# Patient Record
Sex: Male | Born: 1960 | Race: White | Hispanic: No | Marital: Married | Smoking: Never smoker
Health system: Southern US, Community
[De-identification: ages and names within clinical notes are randomized; demographics above are authoritative.]

## PROBLEM LIST (undated history)

## (undated) DIAGNOSIS — E079 Disorder of thyroid, unspecified: Secondary | ICD-10-CM

## (undated) DIAGNOSIS — I7300 Raynaud's syndrome without gangrene: Secondary | ICD-10-CM

## (undated) DIAGNOSIS — Z9109 Other allergy status, other than to drugs and biological substances: Secondary | ICD-10-CM

## (undated) DIAGNOSIS — F329 Major depressive disorder, single episode, unspecified: Secondary | ICD-10-CM

## (undated) DIAGNOSIS — K219 Gastro-esophageal reflux disease without esophagitis: Secondary | ICD-10-CM

## (undated) DIAGNOSIS — F32A Depression, unspecified: Secondary | ICD-10-CM

## (undated) HISTORY — PX: EYE SURGERY: SHX253

## (undated) HISTORY — PX: HERNIA REPAIR: SHX51

## (undated) HISTORY — PX: TONSILLECTOMY: SUR1361

---

## 1999-01-08 ENCOUNTER — Ambulatory Visit (HOSPITAL_COMMUNITY): Admission: RE | Admit: 1999-01-08 | Discharge: 1999-01-08 | Payer: Self-pay | Admitting: Orthopedic Surgery

## 1999-01-08 ENCOUNTER — Encounter: Payer: Self-pay | Admitting: Orthopedic Surgery

## 1999-02-06 ENCOUNTER — Ambulatory Visit (HOSPITAL_COMMUNITY): Admission: RE | Admit: 1999-02-06 | Discharge: 1999-02-06 | Payer: Self-pay | Admitting: *Deleted

## 1999-02-06 ENCOUNTER — Encounter: Payer: Self-pay | Admitting: *Deleted

## 2003-07-10 ENCOUNTER — Encounter: Admission: RE | Admit: 2003-07-10 | Discharge: 2003-07-10 | Payer: Self-pay | Admitting: Family Medicine

## 2003-08-07 ENCOUNTER — Encounter: Admission: RE | Admit: 2003-08-07 | Discharge: 2003-08-07 | Payer: Self-pay | Admitting: Neurological Surgery

## 2003-10-25 ENCOUNTER — Encounter: Admission: RE | Admit: 2003-10-25 | Discharge: 2003-10-25 | Payer: Self-pay | Admitting: Neurology

## 2003-11-16 ENCOUNTER — Encounter: Admission: RE | Admit: 2003-11-16 | Discharge: 2003-11-16 | Payer: Self-pay | Admitting: Neurology

## 2003-11-19 ENCOUNTER — Encounter: Admission: RE | Admit: 2003-11-19 | Discharge: 2003-11-19 | Payer: Self-pay | Admitting: Neurology

## 2005-09-23 ENCOUNTER — Encounter: Admission: RE | Admit: 2005-09-23 | Discharge: 2005-09-23 | Payer: Self-pay | Admitting: Neurology

## 2008-06-07 ENCOUNTER — Ambulatory Visit (HOSPITAL_BASED_OUTPATIENT_CLINIC_OR_DEPARTMENT_OTHER): Admission: RE | Admit: 2008-06-07 | Discharge: 2008-06-07 | Payer: Self-pay | Admitting: General Surgery

## 2009-08-06 ENCOUNTER — Encounter: Admission: RE | Admit: 2009-08-06 | Discharge: 2009-08-06 | Payer: Self-pay | Admitting: Family Medicine

## 2010-06-03 ENCOUNTER — Encounter: Payer: Self-pay | Admitting: Emergency Medicine

## 2010-06-03 ENCOUNTER — Ambulatory Visit
Admission: RE | Admit: 2010-06-03 | Discharge: 2010-06-03 | Payer: Self-pay | Source: Home / Self Care | Admitting: Emergency Medicine

## 2010-06-03 DIAGNOSIS — F3289 Other specified depressive episodes: Secondary | ICD-10-CM | POA: Insufficient documentation

## 2010-06-03 DIAGNOSIS — F329 Major depressive disorder, single episode, unspecified: Secondary | ICD-10-CM | POA: Insufficient documentation

## 2010-06-03 DIAGNOSIS — K219 Gastro-esophageal reflux disease without esophagitis: Secondary | ICD-10-CM | POA: Insufficient documentation

## 2010-06-03 DIAGNOSIS — E039 Hypothyroidism, unspecified: Secondary | ICD-10-CM | POA: Insufficient documentation

## 2010-06-03 DIAGNOSIS — E785 Hyperlipidemia, unspecified: Secondary | ICD-10-CM | POA: Insufficient documentation

## 2010-06-03 DIAGNOSIS — R079 Chest pain, unspecified: Secondary | ICD-10-CM | POA: Insufficient documentation

## 2010-06-09 ENCOUNTER — Telehealth (INDEPENDENT_AMBULATORY_CARE_PROVIDER_SITE_OTHER): Payer: Self-pay | Admitting: *Deleted

## 2010-06-26 NOTE — Progress Notes (Signed)
  Phone Note Outgoing Call Call back at [facility] Phone [phone]   Call placed by: Lajean Saver RN,  June 09, 2010 4:49 PM Call placed to: Patient Action Taken: Phone Call Completed Summary of Call: Callback: Patient reports he is doing much better and is on his last  day of Prednisone

## 2010-06-26 NOTE — Assessment & Plan Note (Signed)
Summary: PAIN ON RIGHT SIDE (RIB AREA)   Vital Signs:  Patient Profile:   50 Years Old Male CC:      right side/rib pain x 4 days Height:     68 inches Weight:      203 pounds O2 Sat:      96 % O2 treatment:    Room Air Temp:     98.9 degrees F oral Pulse rate:   67 / minute Resp:     16 per minute BP sitting:   136 / 89  (left arm) Cuff size:   large  Pt. in pain?   yes    Location:   right side    Intensity:   5    Type:       sharp  Vitals Entered By: Lajean Saver RN (June 03, 2010 11:18 AM)                   Updated Prior Medication List: WELLBUTRIN XL 300 MG XR24H-TAB (BUPROPION HCL)  ADVAIR DISKUS 250-50 MCG/DOSE AEPB (FLUTICASONE-SALMETEROL)  LEVOTHROID 25 MCG TABS (LEVOTHYROXINE SODIUM)  ASTEPRO 0.15 % SOLN (AZELASTINE HCL)  SIMVASTATIN 10 MG TABS (SIMVASTATIN)  RANITIDINE HCL 150 MG CAPS (RANITIDINE HCL)   Current Allergies: ! * GRASS ! * MOLDHistory of Present Illness History from: patient Chief Complaint: right side/rib pain x 4 days History of Present Illness: He was reaching into his car backseat from the driver's seat to reach something and felt a pain in his R side.  This has happened in the past but this time is worse.  Naproxen helps but he ran out.  No trauma.  Hurts with deep breaths and with pressing on it.  No CP, SOB, or other cardiac symptoms.  No URI symptoms.  REVIEW OF SYSTEMS Constitutional Symptoms      Denies fever, chills, night sweats, weight loss, weight gain, and fatigue.  Eyes       Denies change in vision, eye pain, eye discharge, glasses, contact lenses, and eye surgery. Ear/Nose/Throat/Mouth       Denies hearing loss/aids, change in hearing, ear pain, ear discharge, dizziness, frequent runny nose, frequent nose bleeds, sinus problems, sore throat, hoarseness, and tooth pain or bleeding.  Respiratory       Denies dry cough, productive cough, wheezing, shortness of breath, asthma, bronchitis, and emphysema/COPD.       Comments: painful inspiration Cardiovascular       Denies murmurs, chest pain, and tires easily with exhertion.    Gastrointestinal       Denies stomach pain, nausea/vomiting, diarrhea, constipation, blood in bowel movements, and indigestion. Genitourniary       Denies painful urination, kidney stones, and loss of urinary control. Neurological       Denies paralysis, seizures, and fainting/blackouts. Musculoskeletal       Complains of muscle pain and decreased range of motion.      Denies joint pain, joint stiffness, redness, swelling, muscle weakness, and gout.  Skin       Denies bruising, unusual mles/lumps or sores, and hair/skin or nail changes.  Psych       Denies mood changes, temper/anger issues, anxiety/stress, speech problems, depression, and sleep problems. Other Comments: Patient was leaning over to the passenger side of his [employer] Niece to pull up the seat from the drivers side and felt a "pull" on his right side. "it felt like my rib moved over the other one". He has had incrasing pain ever since onhis right  side.    Past History:  Past Medical History: GERD Hyperlipidemia Hypothyroidism Depression Allergies  Past Surgical History: Umbilical hernia  Left foot bone spur removed 2010 Tonsillectomy  Family History: none  Social History: Occupation: Full time Consulting civil engineer- teaching Married Never Smoked Alcohol use-yes Drug use-no 4 childrenSmoking Status:  never Drug Use:  no Physical Exam General appearance: well developed, well nourished, no acute distress Chest/Lungs: no rales, wheezes, or rhonchi bilateral, breath sounds equal without effort Skin: no obvious rashes or lesions Back no tenderness and FROM.  +TTP R midaxillary line around 7th rib and fairly localized.  AP squeezing causes pain.  No swelling or bruising. Assessment New Problems: RIB PAIN, RIGHT SIDED (ICD-786.50) DEPRESSION (ICD-311) HYPOTHYROIDISM (ICD-244.9) HYPERLIPIDEMIA (ICD-272.4) GERD  (ICD-530.81)   Plan New Medications/Changes: VOLTAREN 1 % GEL (DICLOFENAC SODIUM) apply two times a day for 2 weeks  #100g tube x 0, 06/03/2010, Hoyt Koch MD PREDNISONE (PAK) 10 MG TABS (PREDNISONE) 6 day pack, use as directed  #1 x 0, 06/03/2010, Hoyt Koch MD NAPROXEN 500 MG TABS (NAPROXEN) 1 by mouth two times a day as needed pain  #40 x 1, 06/03/2010, Hoyt Koch MD  New Orders: Abdominal binder/Rib belt [L0210] New Patient Level III [99203] Planning Comments:   Rx for Pred pack and when finished can switch to Naproxen.  Also Rx for Voltaren gel.  Rib belt was given and makes him feel better while wearing it.  Ice for a few days to decrease inflammation, hold on heating pad for now.  An Xray was not done because of no trauma and because management would be the same, however if pain continues for another week, can consider Rib Xray to see if a crack is seen.  Avoid heavy lifting, twisting, coughing that would further aggravate that area.  Follow-up with your primary care physician if not improving or if getting worse.   The patient and/or caregiver has been counseled thoroughly with regard to medications prescribed including dosage, schedule, interactions, rationale for use, and possible side effects and they verbalize understanding.  Diagnoses and expected course of recovery discussed and will return if not improved as expected or if the condition worsens. Patient and/or caregiver verbalized understanding.  Prescriptions: VOLTAREN 1 % GEL (DICLOFENAC SODIUM) apply two times a day for 2 weeks  #100g tube x 0   Entered and Authorized by:   Hoyt Koch MD   Signed by:   Hoyt Koch MD on 06/03/2010   Method used:   Print then Give to Patient   RxID:   [id number] PREDNISONE (PAK) 10 MG TABS (PREDNISONE) 6 day pack, use as directed  #1 x 0   Entered and Authorized by:   Hoyt Koch MD   Signed by:   Hoyt Koch MD on 06/03/2010   Method  used:   Print then Give to Patient   RxID:   [id number] NAPROXEN 500 MG TABS (NAPROXEN) 1 by mouth two times a day as needed pain  #40 x 1   Entered and Authorized by:   Hoyt Koch MD   Signed by:   Hoyt Koch MD on 06/03/2010   Method used:   Print then Give to Patient   RxID:   [id number]   Orders Added: 1)  Abdominal binder/Rib belt [L0210] 2)  New Patient Level III [74259]

## 2010-06-26 NOTE — Letter (Signed)
Summary: CONTROLLED MED POLICY   CONTROLLED MED POLICY   Imported By: Dannette Barbara 06/03/2010 11:20:10  _____________________________________________________________________  External Attachment:    Type:   Image     Comment:   External Document

## 2010-09-08 LAB — COMPREHENSIVE METABOLIC PANEL
AST: 28 U/L (ref 0–37)
CO2: 31 mEq/L (ref 19–32)
Calcium: 9.3 mg/dL (ref 8.4–10.5)

## 2010-09-08 LAB — DIFFERENTIAL
Basophils Absolute: 0 10*3/uL (ref 0.0–0.1)
Basophils Relative: 0 % (ref 0–1)
Eosinophils Relative: 5 % (ref 0–5)
Lymphs Abs: 1.5 10*3/uL (ref 0.7–4.0)
Monocytes Absolute: 0.7 10*3/uL (ref 0.1–1.0)
Neutro Abs: 3.2 10*3/uL (ref 1.7–7.7)
Neutrophils Relative %: 56 % (ref 43–77)

## 2010-09-08 LAB — CBC
HCT: 43.6 % (ref 39.0–52.0)
Hemoglobin: 14.7 g/dL (ref 13.0–17.0)
MCHC: 33.8 g/dL (ref 30.0–36.0)
Platelets: 337 10*3/uL (ref 150–400)
RBC: 4.74 MIL/uL (ref 4.22–5.81)
RDW: 13.7 % (ref 11.5–15.5)

## 2010-10-07 NOTE — Op Note (Signed)
NAMEBEACHER, EVERY                 ACCOUNT NO.:  [id number]   MEDICAL RECORD NO.:  [id number]          PATIENT TYPE:  AMB   LOCATION:  [facility]                         FACILITY:  [facility]   PHYSICIAN:  Anselm Pancoast. Weatherly, M.D.DATE OF BIRTH:  1960-10-10   DATE OF PROCEDURE:  06/07/2008  DATE OF DISCHARGE:                               OPERATIVE REPORT   PREOPERATIVE DIAGNOSIS:  Umbilical hernia.   OPERATION:  Repair of umbilical hernia with mesh in the preperitoneal  space.   ANESTHESIA:  General anesthesia.   SURGEON:  Anselm Pancoast. Zachery Dakins, M.D.   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 Holley Bouche, for a mildly symptomatic umbilical  hernia.  I saw the patient about two months ago.  He was talking about  trying to lose weight.  I recommended that we postpone doing anything  and we would see him back in two months, and see if he was able to lose  a little weight.  He returned as scheduled, but had not lost weight.  He  said he had been exercising, as far as walking, etc, but his hernia is  about the same.  There is a little bulge to the right of the umbilicus  and about a 25-cent size defect.  I recommended that we go ahead and  repair this with general anesthesia, and I will place a little piece of  Prolene mesh in the preperitoneal space.   DESCRIPTION OF PROCEDURE:  Preoperatively he was given 1 gram of Ancef.  He does not have PAS stockings, but was taken back to the operative  suite.  Induction of general anesthesia with Dr. __________.  Endotracheal tube, and then the abdomen was force-clipped and prepped  with Betadine solution and draped in a sterile manner.  The patient has  vitiligo and there is a white area at the umbilicus.  He has it in other  areas of his body.  I elected to make a little incision below the  umbilicus, kind of in an area which hopefully will be partially hidden.  The little incision below the  umbilicus was performed.  The skin was  lifted from the underlying subcutaneous tissues.  Then I made the  incision about 1 cm lateral to the umbilicus, as the fascial defect is  small.  Sharp dissection down through the skin and subcutaneous tissue.  The little preperitoneal herniated tissue was freed up  circumferentially.  The fascia was identified.  Then I used #2-0 Vicryl  sutures to actually go ahead and remove the little preperitoneal fatty  tissue and tied the little pedicles, and then closed the little  peritoneal defect with #2-0 locking Vicryl sutures.  I dissected so I  got about one inch under the fascia in all directions, and then I used a  little piece of Prolene mesh, probably about 1.5 inches x about 2.5  inches.  I anchored all four corners with #0 Prolene sutures.  Then  sutures medial, superiorly and inferiorly.  Next, I closed the fascia  over the  mesh, incorporating a little bit of the mesh with #0 Surgilon,  and it came together without any tension.  I then actually put Marcaine  in the incision, all total about 20 mL of Marcaine with adrenalin used.  Then I closed the subcutaneous tissue with #3-0 Vicryl and interrupted  sutures of #4-0 nylon on the skin, alternating simple and mattress  sutures.  Then 4 x 4's rolled up and placed in the umbilicus and a  sterile occlusive dressing applied.   He was awakened from the general anesthesia and will be discharged after  approximately one hour.  Hopefully he will be able to return to work  next week.  He will get his stitches out in approximately one week.  He  will refrain from doing any real strenuous activity for approximately  three to four weeks.           ______________________________  Anselm Pancoast. Zachery Dakins, M.D.     WJW/MEDQ  D:  06/07/2008  T:  06/07/2008  Job:  [id number]   cc:   Holley Bouche, M.D.  Fax: [phone]

## 2011-11-10 ENCOUNTER — Emergency Department
Admission: EM | Admit: 2011-11-10 | Discharge: 2011-11-10 | Disposition: A | Discharge status: Home / Self Care | Payer: PRIVATE HEALTH INSURANCE | Source: Home / Self Care | Attending: Family Medicine | Admitting: Family Medicine

## 2011-11-10 ENCOUNTER — Encounter: Payer: Self-pay | Admitting: *Deleted

## 2011-11-10 DIAGNOSIS — J4520 Mild intermittent asthma, uncomplicated: Secondary | ICD-10-CM

## 2011-11-10 DIAGNOSIS — J069 Acute upper respiratory infection, unspecified: Secondary | ICD-10-CM

## 2011-11-10 HISTORY — DX: Other allergy status, other than to drugs and biological substances: Z91.09

## 2011-11-10 HISTORY — DX: Depression, unspecified: F32.A

## 2011-11-10 HISTORY — DX: Gastro-esophageal reflux disease without esophagitis: K21.9

## 2011-11-10 HISTORY — DX: Disorder of thyroid, unspecified: E07.9

## 2011-11-10 HISTORY — DX: Major depressive disorder, single episode, unspecified: F32.9

## 2011-11-10 MED ORDER — BENZONATATE 200 MG PO CAPS: 200.0000 mg | ORAL_CAPSULE | Freq: Every day | ORAL | Status: AC

## 2011-11-10 MED ORDER — AMOXICILLIN 875 MG PO TABS: 875.0000 mg | ORAL_TABLET | Freq: Two times a day (BID) | ORAL | Status: AC

## 2011-11-10 MED ORDER — PREDNISONE 20 MG PO TABS: 20.0000 mg | ORAL_TABLET | Freq: Two times a day (BID) | ORAL | Status: AC

## 2011-11-10 NOTE — ED Notes (Signed)
Pt c/o nasal congestion, productive cough, and sinus pressure x 3 days. He has taken tylenol and IBF.

## 2011-11-10 NOTE — Discharge Instructions (Signed)
Take Mucinex D (guaifenesin with decongestant) twice daily for congestion, or take plain Mucinex plus Sudafed.  Increase fluid intake, rest. May use Afrin nasal spray (or generic oxymetazoline) twice daily for about 5 days.  Also recommend using saline nasal spray several times daily and saline nasal irrigation (AYR is a common brand).  Continue fluticasone nasal spray. Continue inhalers. Stop all antihistamines for now, and other non-prescription cough/cold preparations. Begin Amoxicillin if not improving about 5 days or if persistent fever develops. Follow-up with family doctor if not improving 7 to 10 days.

## 2011-11-10 NOTE — ED Provider Notes (Signed)
History     CSN: [id number]  Arrival date & time 11/10/11  1751   First MD Initiated Contact with Patient 11/10/11 1808      Chief Complaint  Patient presents with  . Nasal Congestion  . Cough     HPI Comments: Patient complains of approximately 4 day history of gradually progressive URI symptoms beginning with a mild sore throat (now improved), followed by progressive nasal congestion.  A cough started about 2 days ago.  Complains of fatigue but no myalgias.  Cough is now worse at night and generally productive during the day.  There has been no pleuritic pain, but he has had shortness of breath with activity and occasionally wheezes.  He has a history of well controlled intermittent asthma, and over the past several days has had to use his albuterol rescue inhaler several times.  He also uses Advair twice daily, and fluticasone nasal spray.  The history is provided by the patient.    Past Medical History  Diagnosis Date  . GERD (gastroesophageal reflux disease)   . Environmental allergies   . Thyroid disease   . Depression     Past Surgical History  Procedure Date  . Hernia repair   . Tonsillectomy   . Eye surgery     History reviewed. No pertinent family history.  History  Substance Use Topics  . Smoking status: Never Smoker   . Smokeless tobacco: Not on file  . Alcohol Use: Yes      Review of Systems + sore throat, resolved + cough No pleuritic pain + wheezing + nasal congestion + post-nasal drainage ? sinus pain/pressure No itchy/red eyes No earache No hemoptysis + SOB with activity No fever, + chills/sweats No nausea No vomiting No abdominal pain No diarrhea No urinary symptoms No skin rashes + fatigue No myalgias No headache Used OTC meds without relief (Robitussin DM) Allergies  Molds & smuts  Home Medications   Current Outpatient Rx  Name Route Sig Dispense Refill  . BUPROPION HCL ER (XL) 300 MG PO TB24 Oral Take 300 mg by mouth  daily.    Marland Kitchen FLUTICASONE PROPIONATE 50 MCG/ACT NA SUSP Nasal Place 2 sprays into the nose daily.    Marland Kitchen FLUTICASONE-SALMETEROL 250-50 MCG/DOSE IN AEPB Inhalation Inhale 1 puff into the lungs every 12 (twelve) hours.    Marland Kitchen LEVOTHYROXINE SODIUM 25 MCG PO TABS Oral Take 25 mcg by mouth daily.    . ORPHENADRINE CITRATE ER 100 MG PO TB12 Oral Take 100 mg by mouth 2 (two) times daily.    Marland Kitchen RANITIDINE HCL 150 MG PO TABS Oral Take 150 mg by mouth 2 (two) times daily.    Marland Kitchen SIMVASTATIN 10 MG PO TABS Oral Take 10 mg by mouth at bedtime.    . AMOXICILLIN 875 MG PO TABS Oral Take 1 tablet (875 mg total) by mouth 2 (two) times daily. (Rx void after 11/18/11) 20 tablet 0  . BENZONATATE 200 MG PO CAPS Oral Take 1 capsule (200 mg total) by mouth at bedtime. Take as needed for cough 12 capsule 0  . PREDNISONE 20 MG PO TABS Oral Take 1 tablet (20 mg total) by mouth 2 (two) times daily. 10 tablet 0    BP 137/88  Pulse 60  Temp 98.4 F (36.9 C) (Oral)  Resp 18  Ht 5\' 8"  (1.727 m)  Wt 185 lb 12 oz (84.256 kg)  BMI 28.24 kg/m2  SpO2 97%  Physical Exam Nursing notes and Vital Signs reviewed.  Appearance:  Patient appears healthy, stated age, and in no acute distress Eyes:  Pupils are equal, round, and reactive to light and accomodation.  Extraocular movement is intact.  Conjunctivae are not inflamed  Ears:  Canals normal.  Tympanic membranes normal.  Nose:  Mildly congested turbinates.  No sinus tenderness.    Pharynx:  Normal Neck:  Supple.  Slightly prominent shotty posterior nodes are palpated bilaterally  Lungs:  Clear to auscultation.  Breath sounds are equal.  Heart:  Regular rate and rhythm without murmurs, rubs, or gallops.  Abdomen:  Nontender without masses or hepatosplenomegaly.  Bowel sounds are present.  No CVA or flank tenderness.  Extremities:  No edema.  No calf tenderness Skin:  No rash present.   ED Course  Procedures  none      1. Acute upper respiratory infections of unspecified site    2. Intermittent asthma, well controlled       MDM  There is no evidence of bacterial infection today.   Begin prednisone burst.  Prescription written for Benzonatate (Tessalon) to take at bedtime for night-time cough.  Take Mucinex D (guaifenesin with decongestant) twice daily for congestion, or take plain Mucinex plus Sudafed.  Increase fluid intake, rest. May use Afrin nasal spray (or generic oxymetazoline) twice daily for about 5 days.  Also recommend using saline nasal spray several times daily and saline nasal irrigation (AYR is a common brand).  Continue fluticasone nasal spray. Continue inhalers. Stop all antihistamines for now, and other non-prescription cough/cold preparations. Begin Amoxicillin if not improving about 5 days or if persistent fever develops (Given a prescription to hold, with an expiration date)  Follow-up with family doctor if not improving 7 to 10 days.         Lattie Haw, MD 11/10/11 [phone]

## 2014-10-26 ENCOUNTER — Other Ambulatory Visit: Payer: Self-pay | Admitting: Family Medicine

## 2014-10-26 ENCOUNTER — Ambulatory Visit
Admission: RE | Admit: 2014-10-26 | Discharge: 2014-10-26 | Disposition: A | Discharge status: Home / Self Care | Payer: Managed Care, Other (non HMO) | Source: Ambulatory Visit | Attending: Family Medicine | Admitting: Family Medicine

## 2014-10-26 DIAGNOSIS — M25552 Pain in left hip: Secondary | ICD-10-CM

## 2015-03-21 ENCOUNTER — Emergency Department
Admission: EM | Admit: 2015-03-21 | Discharge: 2015-03-21 | Disposition: A | Discharge status: Home / Self Care | Payer: Managed Care, Other (non HMO) | Source: Home / Self Care | Attending: Family Medicine | Admitting: Family Medicine

## 2015-03-21 ENCOUNTER — Encounter: Payer: Self-pay | Admitting: *Deleted

## 2015-03-21 DIAGNOSIS — J0100 Acute maxillary sinusitis, unspecified: Secondary | ICD-10-CM

## 2015-03-21 DIAGNOSIS — J069 Acute upper respiratory infection, unspecified: Secondary | ICD-10-CM

## 2015-03-21 HISTORY — DX: Raynaud's syndrome without gangrene: I73.00

## 2015-03-21 MED ORDER — BENZONATATE 100 MG PO CAPS: 100.0000 mg | ORAL_CAPSULE | Freq: Three times a day (TID) | ORAL | Status: DC

## 2015-03-21 MED ORDER — PREDNISONE 20 MG PO TABS: ORAL_TABLET | ORAL | Status: DC

## 2015-03-21 MED ORDER — DOXYCYCLINE HYCLATE 100 MG PO CAPS: 100.0000 mg | ORAL_CAPSULE | Freq: Two times a day (BID) | ORAL | Status: DC

## 2015-03-21 MED ORDER — ALBUTEROL SULFATE HFA 108 (90 BASE) MCG/ACT IN AERS: 1.0000 | INHALATION_SPRAY | Freq: Four times a day (QID) | RESPIRATORY_TRACT | Status: AC | PRN

## 2015-03-21 NOTE — Discharge Instructions (Signed)
Please take antibiotics as prescribed and be sure to complete entire course even if you start to feel better to ensure infection does not come back.  You may take Naproxen and Alternate with Tylenol  You may take 500mg  Tylenol every 4-6 hours as needed for fever and pain  Follow-up with your primary care provider next week for recheck of symptoms if not improving.  Be sure to drink plenty of fluids and rest, at least 8hrs of sleep a night, preferably more while you are sick. Return urgent care or go to closest ER if you cannot keep down fluids/signs of dehydration, fever not reducing with Tylenol, difficulty breathing/wheezing, stiff neck, worsening condition, or other concerns (see below)

## 2015-03-21 NOTE — ED Notes (Signed)
Pt c/o non-productive cough, sinus congestion, facial pain and HA x 5-6 days. Taken Delsym and Zyrtec without much relief.

## 2015-03-21 NOTE — ED Provider Notes (Signed)
CSN: [id number]     Arrival date & time 03/21/15  1600 History   First MD Initiated Contact with Patient 03/21/15 1606     Chief Complaint  Patient presents with  . URI   (Consider location/radiation/quality/duration/timing/severity/associated sxs/prior Treatment) HPI Pt is a 54[facility] with c/o gradually worsening dry hacking cough with sinus congestion, frontal headache with facial pain for 5-6 days.  He has tried Delsym and Zyrtec w/o relief. He reports hx of mild intermittent well controlled asthma but has had prednisone in the past when he gets sick.  Pt also notes his wife has been sick with similar symptoms.  Denies fever, chills, n/v/d. He has not received the flu vaccine yet. Denies recent travel.   Past Medical History  Diagnosis Date  . GERD (gastroesophageal reflux disease)   . Environmental allergies   . Thyroid disease   . Depression   . Raynaud disease    Past Surgical History  Procedure Laterality Date  . Hernia repair    . Tonsillectomy    . Eye surgery     History reviewed. No pertinent family history. Social History  Substance Use Topics  . Smoking status: Never Smoker   . Smokeless tobacco: Never Used  . Alcohol Use: Yes    Review of Systems  Constitutional: Negative for fever and chills.  HENT: Positive for congestion, rhinorrhea, sinus pressure and sore throat. Negative for ear pain, trouble swallowing and voice change.   Respiratory: Positive for cough, shortness of breath and wheezing.   Cardiovascular: Negative for chest pain and palpitations.  Gastrointestinal: Negative for nausea, vomiting, abdominal pain and diarrhea.  Musculoskeletal: Negative for myalgias, back pain and arthralgias.  Skin: Negative for rash.  Neurological: Positive for headaches. Negative for dizziness and light-headedness.  All other systems reviewed and are negative.   Allergies  Molds & smuts  Home Medications   Prior to Admission medications    Medication Sig Start Date End Date Taking? Authorizing Provider  buPROPion (WELLBUTRIN XL) 300 MG 24 hr tablet Take 300 mg by mouth daily.   Yes Historical Provider, MD  Escitalopram Oxalate (LEXAPRO PO) Take by mouth.   Yes Historical Provider, MD  fluticasone (FLONASE) 50 MCG/ACT nasal spray Place 2 sprays into the nose daily.   Yes Historical Provider, MD  Fluticasone-Salmeterol (ADVAIR) 250-50 MCG/DOSE AEPB Inhale 1 puff into the lungs every 12 (twelve) hours.   Yes Historical Provider, MD  levothyroxine (SYNTHROID, LEVOTHROID) 25 MCG tablet Take 25 mcg by mouth daily.   Yes Historical Provider, MD  omeprazole (PRILOSEC) 40 MG capsule Take 40 mg by mouth daily.   Yes Historical Provider, MD  oxybutynin (DITROPAN) 5 MG tablet Take 5 mg by mouth 3 (three) times daily.   Yes Historical Provider, MD  albuterol (PROVENTIL HFA;VENTOLIN HFA) 108 (90 BASE) MCG/ACT inhaler Inhale 1-2 puffs into the lungs every 6 (six) hours as needed for wheezing or shortness of breath. 03/21/15   Junius FinnerErin O'Malley, PA-C  benzonatate (TESSALON) 100 MG capsule Take 1 capsule (100 mg total) by mouth every 8 (eight) hours. 03/21/15   Junius FinnerErin O'Malley, PA-C  doxycycline (VIBRAMYCIN) 100 MG capsule Take 1 capsule (100 mg total) by mouth 2 (two) times daily. One po bid x 7 days 03/21/15   Junius FinnerErin O'Malley, PA-C  predniSONE (DELTASONE) 20 MG tablet 3 tabs po day one, then 2 po daily x 4 days 03/21/15   Junius FinnerErin O'Malley, PA-C   Meds Ordered and Administered this Visit  Medications -  No data to display  BP 149/88 mmHg  Pulse 64  Temp(Src) 98.1 F (36.7 C) (Oral)  Wt 223 lb (101.152 kg)  SpO2 95% No data found.   Physical Exam  Constitutional: He appears well-developed and well-nourished.  HENT:  Head: Normocephalic and atraumatic.  Right Ear: Hearing, tympanic membrane, external ear and ear canal normal.  Left Ear: Hearing, tympanic membrane, external ear and ear canal normal.  Nose: Mucosal edema present. Right sinus exhibits  maxillary sinus tenderness. Right sinus exhibits no frontal sinus tenderness. Left sinus exhibits maxillary sinus tenderness. Left sinus exhibits no frontal sinus tenderness.  Mouth/Throat: Uvula is midline, oropharynx is clear and moist and mucous membranes are normal.  Eyes: Conjunctivae are normal. No scleral icterus.  Neck: Normal range of motion. Neck supple.  Cardiovascular: Normal rate, regular rhythm and normal heart sounds.   Pulmonary/Chest: Effort normal and breath sounds normal. No respiratory distress. He has no wheezes. He has no rales. He exhibits no tenderness.  No respiratory distress. Occasional dry hacking cough  Abdominal: Soft. Bowel sounds are normal. He exhibits no distension and no mass. There is no tenderness. There is no rebound and no guarding.  Musculoskeletal: Normal range of motion.  Neurological: He is alert.  Skin: Skin is warm and dry.  Nursing note and vitals reviewed.   ED Course  Procedures (including critical care time)  Labs Review Labs Reviewed - No data to display  Imaging Review No results found.     MDM   1. Acute maxillary sinusitis, recurrence not specified   2. Acute upper respiratory infection     Pt c/o worsening cough with nasal congestion and facial pain for 1 week. Signs and symptoms c/w maxillary sinusitis with URI Rx: doxycyline, prednisone, albuterol, and tessalon. Advised pt to use acetaminophen and ibuprofen as needed for fever and pain. Encouraged rest and fluids. F/u with PCP in 1 week if not improving, sooner if worsening.  Pt verbalized understanding and agreement with tx plan.     Junius Finner, PA-C 03/21/15 1722

## 2015-04-02 ENCOUNTER — Encounter: Payer: Self-pay | Admitting: Internal Medicine

## 2015-05-02 ENCOUNTER — Encounter: Payer: Self-pay | Admitting: Internal Medicine

## 2015-05-02 ENCOUNTER — Ambulatory Visit (INDEPENDENT_AMBULATORY_CARE_PROVIDER_SITE_OTHER): Payer: Managed Care, Other (non HMO) | Admitting: Internal Medicine

## 2015-05-02 VITALS — BP 124/78 | HR 63 | Temp 97.5°F | Resp 12 | Ht 67.0 in | Wt 220.0 lb

## 2015-05-02 DIAGNOSIS — R61 Generalized hyperhidrosis: Secondary | ICD-10-CM | POA: Diagnosis not present

## 2015-05-02 DIAGNOSIS — E039 Hypothyroidism, unspecified: Secondary | ICD-10-CM | POA: Diagnosis not present

## 2015-05-02 DIAGNOSIS — E291 Testicular hypofunction: Secondary | ICD-10-CM | POA: Diagnosis not present

## 2015-05-02 NOTE — Patient Instructions (Signed)
Please stay off Testosterone for now.  Come back for labs in 2 months, fasting, at 8 am.   Please come back for a follow-up appointment in 4 months.

## 2015-05-02 NOTE — Progress Notes (Signed)
Patient ID: Zachary Aguirre   DOB: 1960-12-23, 54 y.o.   MRN: [id number]  HPI: Zachary Aguirre is a 54 y.o.-year-old man, referred by his PCP, Dr. Tiburcio Pea, for evaluation and management of hypogonadism and hypothyroidism.  He was diagnosed with hypogonadism in Spring 2016, during investigation for sweating and weight gain. He was started on testosterone 1.62% AndroGel, and he is currently applying for pumps daily. Per review of PCPs office visit notes, the AndroGel dose was increased in 09/2014, as a testosterone level was low at that time at 141. After this increase in dose, a recent testosterone level from 02/04/2015 was normal, 394.6 ([phone]). He started to feel a little better after he increased testosterone.  Of note, he also has excessive sweating, and uses Ditropan 5 mg twice a day for this. He is on omeprazole since 2 years ago.  He did not take Testosterone for 2 weeks. Does not feel different.  He admits for decreased libido >> slightly better Has difficulty obtaining or maintaining an erection >> only a little better. Tried Viagra >> worked, but HA the second time he used it. Did not see urology. No trauma to testes, testicular irradiation or surgery No h/o of mumps orchitis/but has h/o autoimmune ds.(vitiligo) No h/o cryptorchidism. He grew and went through puberty like his peers. No shrinking of testes. No very small testes (<5 ml).    No breast discomfort/gynecomastia    No loss of body hair (axillary/pubic)/decreased need for shaving. + height loss. No abnormal sense of smell (only allergies). ? hot flushes No vision problems. No worst HA of his life. No FH of hypogonadism/infertility  No personal h/o infertility - has 4 children No FH of hemochromatosis or pituitary tumors + weight gain (35 lbs in last 2 year) - cut out snacks No chronic diseases No chronic pain. Not on opiates, had steroid inj in spine - 2005. Had Prednisone tapers for SOB. No more than 2 drinks a  day of alcohol at a time, and this is rarely No anabolic steroids use. No herbal medicines. He is on bupropion. He continues Lexapro. ? family history of early cardiac disease.  He also has a history of elevated AST and ALT.  Hypothyroidism: - dx 2016 - Latest TSH (02/04/2015) 3.12 on LT4 50 g daily. - He takes the levothyroxine: Fasting With water Eats breakfast more than 30 minutes later Takes omeprazole in am, close to LT4! Takes multivitamins in am! No calcium, iron.  ROS: Constitutional: + weight gain, + fatigue, + subjective hyperthermia, + poor sleep Eyes: no blurry vision, no xerophthalmia ENT: no sore throat, no nodules palpated in throat, no dysphagia/odynophagia, no hoarseness, + decreased hearing Cardiovascular: no CP/SOB/palpitations/leg swelling Respiratory: no cough/SOB/+ wheezing Gastrointestinal: no N/V/D/C, + acid reflux Musculoskeletal: no muscle/+ joint aches Skin: no rashes Neurological: no tremors/numbness/tingling/dizziness Psychiatric: + both: depression/anxiety + see HPI  Past Medical History  Diagnosis Date  . GERD (gastroesophageal reflux disease)   . Environmental allergies   . Thyroid disease   . Depression   . Raynaud disease    Past Surgical History  Procedure Laterality Date  . Hernia repair    . Tonsillectomy    . Eye surgery     Social History   Social History  . Marital Status: Married    Spouse Name: N/A  . Number of Children: 4   Occupational History  . librarian   Social History Main Topics  . Smoking status: Never Smoker   . Smokeless tobacco:  Never Used  . Alcohol Use: Yes, wine, beer - socially  . Drug Use: No   Current Outpatient Prescriptions on File Prior to Visit  Medication Sig Dispense Refill  . albuterol (PROVENTIL HFA;VENTOLIN HFA) 108 (90 BASE) MCG/ACT inhaler Inhale 1-2 puffs into the lungs every 6 (six) hours as needed for wheezing or shortness of breath. 1 Inhaler 0  . buPROPion (WELLBUTRIN XL) 300  MG 24 hr tablet Take 300 mg by mouth daily.    . Escitalopram Oxalate (LEXAPRO PO) Take 10 mg by mouth daily.     . fluticasone (FLONASE) 50 MCG/ACT nasal spray Place 2 sprays into the nose daily.    . Fluticasone-Salmeterol (ADVAIR) 250-50 MCG/DOSE AEPB Inhale 1 puff into the lungs every 12 (twelve) hours.    Marland Kitchen. levothyroxine (SYNTHROID, LEVOTHROID) 25 MCG tablet Take 25 mcg by mouth daily.    Marland Kitchen. omeprazole (PRILOSEC) 40 MG capsule Take 40 mg by mouth daily.    Marland Kitchen. oxybutynin (DITROPAN) 5 MG tablet Take 5 mg by mouth 3 (three) times daily.    . predniSONE (DELTASONE) 20 MG tablet 3 tabs po day one, then 2 po daily x 4 days 11 tablet 0   No current facility-administered medications on file prior to visit.   Allergies  Allergen Reactions  . Ibuprofen Other (See Comments)  . Molds & Smuts    No family history on file. patient is adopted.  PE: BP 124/78 mmHg  Pulse 63  Temp(Src) 97.5 F (36.4 C) (Oral)  Resp 12  Ht 5\' 7"  (1.702 m)  Wt 220 lb (99.791 kg)  BMI 34.45 kg/m2  SpO2 97% Wt Readings from Last 3 Encounters:  05/02/15 220 lb (99.791 kg)  03/21/15 223 lb (101.152 kg)  11/10/11 185 lb 12 oz (84.256 kg)   Constitutional: overweight, in NAD Eyes: PERRLA, EOMI, no exophthalmos ENT: moist mucous membranes, no thyromegaly, no cervical lymphadenopathy Cardiovascular: RRR, No MRG Respiratory: CTA B Gastrointestinal: abdomen soft, NT, ND, BS+ Musculoskeletal: no deformities, strength intact in all 4 Skin: moist, warm, no rashes Neurological: no tremor with outstretched hands, DTR normal in all 4 Genital exam: normal male escutcheon, no inguinal LAD, normal phallus, testes ~25 mL, no testicular masses, no penile discharge. Skin discoloration (dark, macular, on the right scrotum) No gynecomastia.  ASSESSMENT: 1. Hypogonadism  2. Hypothyroidism  3. Excessive sweating  PLAN:  1. Hypogonadism - Patient with a relatively recent history of hypogonadism, which was diagnosed during  investigation for excessive sweating and weight gain. He also mentions that he does have low libido and problems with erections, which have not improved too much even after starting testosterone gel this spring. He is currently using 4 pumps of AndroGel daily and a testosterone level obtained 3 months ago was within normal range. He is disappointed about the lack of effect of testosterone. - I am not if patient had investigation for hypogonadism at diagnosis, but he does not remember having this. At this visit we discussed about needing to know why he has the low testosterone. We discussed about possible causes, to include:   Drugs (he is on Lexapro and has had steroid injections in the past, but not in the last 10 years per his report; he has not used anabolic steroids in the past that is not using illicit drugs now)  Pituitary tumors  Chronic conditions (he does not have diabetes, severe hyperlipidemia; he does have elevated transaminases...)  Weight gain (he has had quite a bit of weight gain over the  years which can cause hypogonadism)  Obstructive sleep apnea (no history of this)  Age-related (possibly)  Hemochromatosis  Testicular pathology including cancer - He hurt his shoulder 2 weeks ago and was not able to apply his testosterone for this period. We discussed to continue to stay off testosterone for another 2 months and come back for further investigation and the end of this period to check for causes of his hypogonadism. He agrees with this and is actually relieved that he does not need to continue testosterone for now. We did discuss about the need to restart testosterone after our investigation, but also the possibility that he does not restart the medication. He will think about it. - At this point, I am not sure if he has primary or secondary hypogonadism, but will check the following tests to clarify: Orders Placed This Encounter  Procedures  . Testosterone, Free, Total, SHBG   . Insulin-like growth factor  . Follicle stimulating hormone  . T4, free  . TSH  . Estradiol  . Beta HCG, Quant (tumor marker)  . Prolactin  . IBC Panel  . PSA  . Cortisol  . ACTH  . Hemoglobin A1c  - I advised the patient that we may need a pituitary MRI when the results of the tests are back  2. Hypothyroidism - I explained that his mildly elevated TSH could have appeared in the setting of his weight gain, and it may improve when he loses the weight - Continue levothyroxine 50 g for now - I advised him to take the thyroid hormone every day, with water, >30 minutes before breakfast, separated by >4 hours from: - acid reflux medications - calcium - iron - multivitamins. - We'll recheck his thyroid tests in 2 months  3. Excessive sweating - Could be related to the omeprazole - I advised him to give it a try without the omeprazole, and replace it with ranitidine, but he mentions that his GERD is severe and he had forgotten the omeprazole in the past with great increasing his symptoms.  - time spent with the patient: 1 hour, of which >50% was spent in obtaining information about his symptoms, reviewing his previous labs, evaluations, and treatments, counseling him about his condition (please see the discussed topics above), and developing a plan to further investigate it; he had a number of questions which I addressed.

## 2015-07-03 ENCOUNTER — Other Ambulatory Visit: Payer: Managed Care, Other (non HMO)

## 2015-07-23 ENCOUNTER — Other Ambulatory Visit (INDEPENDENT_AMBULATORY_CARE_PROVIDER_SITE_OTHER): Payer: Managed Care, Other (non HMO)

## 2015-07-23 DIAGNOSIS — E291 Testicular hypofunction: Secondary | ICD-10-CM | POA: Diagnosis not present

## 2015-07-23 LAB — FOLLICLE STIMULATING HORMONE: FSH: 5.9 m[IU]/mL (ref 1.4–18.1)

## 2015-07-23 LAB — T4, FREE: Free T4: 0.83 ng/dL (ref 0.60–1.60)

## 2015-07-23 LAB — IBC PANEL
IRON: 70 ug/dL (ref 42–165)
SATURATION RATIOS: 20.6 % (ref 20.0–50.0)
Transferrin: 243 mg/dL (ref 212.0–360.0)

## 2015-07-23 LAB — HEMOGLOBIN A1C: Hgb A1c MFr Bld: 6.1 % (ref 4.6–6.5)

## 2015-07-23 LAB — CORTISOL: Cortisol, Plasma: 6.8 ug/dL

## 2015-07-23 LAB — PSA: PSA: 0.3 ng/mL (ref 0.10–4.00)

## 2015-07-23 LAB — TSH: TSH: 3.81 u[IU]/mL (ref 0.35–4.50)

## 2015-07-25 LAB — TESTOSTERONE, FREE AND TOTAL (INCLUDES SHBG)-(MALES)
SEX HORMONE BINDING: 19 nmol/L (ref 10–50)
TESTOSTERONE FREE: 39.8 pg/mL — AB (ref 47.0–244.0)
Testosterone-% Free: 2.5 % (ref 1.6–2.9)
Testosterone: 160 ng/dL — ABNORMAL LOW (ref 250–827)

## 2015-07-25 LAB — PROLACTIN: PROLACTIN: 3.9 ng/mL (ref 2.0–18.0)

## 2015-07-26 LAB — ACTH: C206 ACTH: 20 pg/mL (ref 6–50)

## 2015-07-26 LAB — INSULIN-LIKE GROWTH FACTOR
IGF-I, LC/MS: 71 ng/mL (ref 50–317)
Z-SCORE (MALE): -1.3 {STDV} (ref ?–2.0)

## 2015-07-26 LAB — BETA HCG QUANT (REF LAB): Beta hCG, Tumor Marker: <2 m[IU]/mL (ref <5.0)

## 2015-07-31 LAB — ESTRADIOL, FREE
Estradiol, Free: 0.45 pg/mL (ref <=0.45)
Estradiol: 22 pg/mL (ref <=29)

## 2015-08-02 ENCOUNTER — Other Ambulatory Visit: Payer: Self-pay | Admitting: Internal Medicine

## 2015-08-02 DIAGNOSIS — E291 Testicular hypofunction: Secondary | ICD-10-CM

## 2015-08-13 ENCOUNTER — Inpatient Hospital Stay: Admission: RE | Admit: 2015-08-13 | Payer: Managed Care, Other (non HMO) | Source: Ambulatory Visit

## 2015-08-15 ENCOUNTER — Encounter: Payer: Self-pay | Admitting: Internal Medicine

## 2015-08-15 ENCOUNTER — Telehealth: Payer: Self-pay | Admitting: Internal Medicine

## 2015-08-15 NOTE — Telephone Encounter (Signed)
Insurance denied and pt cancelled appt scheduled for 08/13/15 on 08/12/15 and said he had an emergency and would have to call back later to r/s.

## 2015-08-29 ENCOUNTER — Telehealth: Payer: Self-pay | Admitting: Internal Medicine

## 2015-08-29 NOTE — Telephone Encounter (Signed)
Please read message below and advise.  

## 2015-08-29 NOTE — Telephone Encounter (Signed)
Yes, it was sent. Have not heard from them.

## 2015-08-29 NOTE — Telephone Encounter (Signed)
Pt insurance [employer] has declined him having the mri, he does not know what to do he cancelled his upcoming appt because of this

## 2015-08-29 NOTE — Telephone Encounter (Signed)
He should not have cancelled... I put together a letter for the appeal. We did send that, right?

## 2015-08-30 ENCOUNTER — Ambulatory Visit: Payer: Managed Care, Other (non HMO) | Admitting: Internal Medicine

## 2016-01-15 ENCOUNTER — Ambulatory Visit: Payer: Managed Care, Other (non HMO) | Admitting: Internal Medicine

## 2016-03-12 ENCOUNTER — Encounter: Payer: Self-pay | Admitting: Internal Medicine

## 2016-03-12 ENCOUNTER — Ambulatory Visit (INDEPENDENT_AMBULATORY_CARE_PROVIDER_SITE_OTHER): Payer: Managed Care, Other (non HMO) | Admitting: Internal Medicine

## 2016-03-12 VITALS — BP 132/88 | HR 68 | Wt 207.0 lb

## 2016-03-12 DIAGNOSIS — E291 Testicular hypofunction: Secondary | ICD-10-CM | POA: Diagnosis not present

## 2016-03-12 DIAGNOSIS — E039 Hypothyroidism, unspecified: Secondary | ICD-10-CM

## 2016-03-12 LAB — T4, FREE: Free T4: 0.81 ng/dL (ref 0.60–1.60)

## 2016-03-12 LAB — TSH: TSH: 1.73 u[IU]/mL (ref 0.35–4.50)

## 2016-03-12 NOTE — Patient Instructions (Signed)
Please stop at the lab.  Please continue levothyroxine 50 mcg daily.  Take the thyroid hormone every day, with water, at least 30 minutes before breakfast, separated by at least 4 hours from: - acid reflux medications - calcium - iron - multivitamins  Please come back for a follow-up appointment in 6 months.

## 2016-03-12 NOTE — Progress Notes (Signed)
Patient ID: Zachary Aguirre, male   DOB: 22-Aug-1960, 55 y.o.   MRN: [id number]  HPI: Zachary Aguirre is a 55 y.o.-year-old man, initially referred by his PCP, Dr. 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 Last visit 04/2015.  Reviewed and addended hx: He was diagnosed with hypogonadism in Spring 2016, during investigation for sweating and weight gain. He was started on testosterone 1.62% AndroGel >> 4 pumps daily. Per review of PCPs office visit notes, the AndroGel dose was increased in 09/2014, as a testosterone level was low at that time at 141. After this increase in dose, a  testosterone level from 02/04/2015 was normal, 394.6 ([phone]). He started to feel a little better after he increased testosterone.  Of note, he also has excessive sweating, and uses Ditropan 5 mg twice a day for this. He is on omeprazole since 2 years ago.  At last visit, he was off Testosterone for 2 weeks. Did not feel different.  We continued off testosterone >> repeat labs showed a low T, but normal pituitary hh and other pertinent labs: Component     Latest Ref Rng & Units 07/23/2015  Testosterone     250 - 827 ng/dL 409 (L)  Sex Horm Binding Glob, Serum     10 - 50 nmol/L 19  Testosterone Free     47.0 - 244.0 pg/mL 39.8 (L)  Testosterone-% Free     1.6 - 2.9 % 2.5  Iron     42 - 165 ug/dL 70  Transferrin     811.9 - 360.0 mg/dL 147.8  Saturation Ratios     20.0 - 50.0 % 20.6  IGF-I, LC/MS     50 - 317 ng/mL 71  Z-Score (Male)     -2.0 - 2.0 SD -1.3  Estradiol, Free     ADULTS: < OR = 0.45 pg/mL 0.45  Estradiol     ADULTS: < OR = 29 pg/mL 22  FSH     1.4 - 18.1 mIU/ML 5.9  Beta hCG, Tumor Marker     <5.0 mIU/mL < 2.0  Prolactin     2.0 - 18.0 ng/mL 3.9  PSA     0.10 - 4.00 ng/mL 0.30  Cortisol, Plasma     ug/dL 6.8  G956 ACTH     6 - 50 pg/mL 20  Hemoglobin A1C     4.6 - 6.5 % 6.1  We wanted to check a pituitary MRI >> not covered by  insurance.  He restarted testosterone gel - 2 pumps a day 08/2015 >> but again stopped after the following labs in 11/2015: Labs by PCP: 12/04/2015: Total testosterone 211 ([phone]) - 10:37 am  He feels tired, initially weight gain, now stabilized, libido nonexistent, excessive sweating - became worse when tried to come off Omeprazole.   He admits for decreased libido. Has difficulty obtaining or maintaining an erection >> only a little better. Tried Viagra >> worked, but HA the second time he used it. Did not see urology. No trauma to testes, testicular irradiation or surgery No h/o of mumps orchitis/but has h/o autoimmune ds.(vitiligo) No h/o cryptorchidism. He grew and went through puberty like his peers. No shrinking of testes. No very small testes (<5 ml).    No breast discomfort/gynecomastia    No loss of body hair (axillary/pubic)/decreased need for shaving. + height loss. No abnormal sense of smell (only allergies). ? hot flushes No vision problems. No worst HA of his life. No FH  of hypogonadism/infertility  No personal h/o infertility - has 4 children No FH of hemochromatosis or pituitary tumors + weight gain (35 lbs in last 2 year) - cut out snacks No chronic diseases No chronic pain. Not on opiates, had steroid inj in spine - 2005. Had Prednisone tapers for SOB. No more than 2 drinks a day of alcohol at a time, and this is rarely No anabolic steroids use. No herbal medicines. He is on bupropion. He continues Lexapro. ? family history of early cardiac disease.  He also has a history of elevated AST and ALT.  Hypothyroidism: - dx 2016 - Latest TSH (02/04/2015) 3.12 on LT4 50 g daily. - He takes the levothyroxine: Fasting With water Eats breakfast more than 30 minutes later Takes omeprazole later in the day Takes multivitamins later in the day No calcium, iron.  Latest TFTs: Lab Results  Component Value Date   TSH 3.81 07/23/2015   FREET4 0.83 07/23/2015     ROS: Constitutional: + weight gain, + fatigue, + subjective hyperthermia, + poor sleep Eyes: no blurry vision, no xerophthalmia ENT: no sore throat, no nodules palpated in throat, no dysphagia/odynophagia, no hoarseness Cardiovascular: no CP/SOB/palpitations/leg swelling Respiratory: no cough/SOB/ wheezing Gastrointestinal: no N/V/D/C, + acid reflux Musculoskeletal: no muscle/joint aches Skin: no rashes Neurological: no tremors/numbness/tingling/dizziness  I reviewed pt's medications, allergies, PMH, social hx, family hx, and changes were documented in the history of present illness. Otherwise, unchanged from my initial visit note.  Past Medical History:  Diagnosis Date  . Depression   . Environmental allergies   . GERD (gastroesophageal reflux disease)   . Raynaud disease   . Thyroid disease    Past Surgical History:  Procedure Laterality Date  . EYE SURGERY    . HERNIA REPAIR    . TONSILLECTOMY     Social History   Social History  . Marital Status: Married    Spouse Name: N/A  . Number of Children: 4   Occupational History  . librarian   Social History Main Topics  . Smoking status: Never Smoker   . Smokeless tobacco: Never Used  . Alcohol Use: Yes, wine, beer - socially  . Drug Use: No   Current Outpatient Prescriptions on File Prior to Visit  Medication Sig Dispense Refill  . albuterol (PROVENTIL HFA;VENTOLIN HFA) 108 (90 BASE) MCG/ACT inhaler Inhale 1-2 puffs into the lungs every 6 (six) hours as needed for wheezing or shortness of breath. 1 Inhaler 0  . buPROPion (WELLBUTRIN XL) 300 MG 24 hr tablet Take 300 mg by mouth daily.    . Escitalopram Oxalate (LEXAPRO PO) Take 10 mg by mouth daily.     . fluticasone (FLONASE) 50 MCG/ACT nasal spray Place 2 sprays into the nose daily.    . Fluticasone-Salmeterol (ADVAIR) 250-50 MCG/DOSE AEPB Inhale 1 puff into the lungs every 12 (twelve) hours.    Marland Kitchen. levothyroxine (SYNTHROID, LEVOTHROID) 25 MCG tablet Take 25 mcg  by mouth daily.    Marland Kitchen. omeprazole (PRILOSEC) 40 MG capsule Take 40 mg by mouth daily.    Marland Kitchen. oxybutynin (DITROPAN) 5 MG tablet Take 5 mg by mouth 3 (three) times daily.    . predniSONE (DELTASONE) 20 MG tablet 3 tabs po day one, then 2 po daily x 4 days 11 tablet 0  . ANDROGEL PUMP 20.25 MG/ACT (1.62%) GEL apply TWO pumps EVERY DAY  5   No current facility-administered medications on file prior to visit.    Allergies  Allergen Reactions  . Ibuprofen  Other (See Comments)  . Molds & Smuts    No family history on file. patient is adopted.  PE: BP 132/88 (BP Location: Left Arm, Patient Position: Sitting)   Pulse 68   Wt 207 lb (93.9 kg)   SpO2 96%   BMI 32.42 kg/m  Wt Readings from Last 3 Encounters:  03/12/16 207 lb (93.9 kg)  05/02/15 220 lb (99.8 kg)  03/21/15 223 lb (101.2 kg)   Constitutional: overweight, in NAD Eyes: PERRLA, EOMI, no exophthalmos ENT: moist mucous membranes, no thyromegaly, no cervical lymphadenopathy Cardiovascular: RRR, No MRG Respiratory: CTA B Gastrointestinal: abdomen soft, NT, ND, BS+ Musculoskeletal: no deformities, strength intact in all 4 Skin: moist, warm, no rashes Neurological: no tremor with outstretched hands, DTR normal in all 4  ASSESSMENT: 1. Hypogonadotropic Hypogonadism  2. Hypothyroidism  PLAN:  1. Hypogonadotropic Hypogonadism - Patient with a relatively recent history of hypogonadism, which was diagnosed during investigation for excessive sweating and weight gain. He also mentions that he does have low libido and problems with erections, which have not improved too much even after starting testosterone gel Spring 2016. He then came off testosterone but feels that his sweating and weight gain was possibly worse after this. He restarted testosterone in 08/2015 and stopped in 11/2015. He has now been off testosterone for approximately 3 months. However, due to his low libido, weight gain and increased sweating, he would be interested to  restart it. - I do not have a clear reason for his hypogonadism. Possible etiologies:  Drugs (he is on Lexapro and has had steroid injections in the past, but not in the last 10 years per his report; he has not used anabolic steroids in the past that is not using illicit drugs now)  Pituitary tumor - I doubt this in the setting of normal pituitary workup (except testosterone) - an MRI was denied by his insurance...  Chronic conditions (he does not have diabetes, severe hyperlipidemia; he does have elevated transaminases...)  Weight gain (he has had quite a bit of weight gain over the years which can cause hypogonadism)  Obstructive sleep apnea (no history of this)  Age-related (possibly)  Hemochromatosis (IBC panel was normal)   Testicular pathology including cancer (Beta hCG and estradiol were normal)  - we will check his testosterone again today, since he is fasting - He agrees to restart testosterone if this is low.  2. Hypothyroidism - Continue levothyroxine 50 g for now - I advised him to take the thyroid hormone every day, with water, >30 minutes before breakfast, separated by >4 hours from: - acid reflux medications - calcium - iron - multivitamins. At last visit, we separated PPIs a multivitamins from LT 4. He is taking it correctly now. - We'll recheck his thyroid tests today  Component     Latest Ref Rng & Units 03/12/2016  Testosterone     264 - 916 ng/dL 409 (L)  Sex Horm Binding Glob, Serum     19.3 - 76.4 nmol/L 24.7  Testosterone Free     7.2 - 24.0 pg/mL 5.0 (L)  T4,Free(Direct)     0.60 - 1.60 ng/dL 8.11  TSH     9.14 - 7.82 uIU/mL 1.73   Testosterone is still low >> will restart Testosterone at a lower dose, 1 pump daily and recheck a level in 2 months.  Carlus Pavlov, MD PhD [facility] End[facility]

## 2016-03-13 ENCOUNTER — Other Ambulatory Visit: Payer: Self-pay

## 2016-03-13 ENCOUNTER — Telehealth: Payer: Self-pay

## 2016-03-13 LAB — TESTOSTERONE, FREE, TOTAL, SHBG
Sex Hormone Binding: 24.7 nmol/L (ref 19.3–76.4)
TESTOSTERONE FREE: 5 pg/mL — AB (ref 7.2–24.0)
Testosterone: 212 ng/dL — ABNORMAL LOW (ref 264–916)

## 2016-03-13 MED ORDER — TESTOSTERONE 20.25 MG/1.25GM (1.62%) TD GEL: TRANSDERMAL | 1 refills | Status: DC

## 2016-03-13 MED ORDER — ANDROGEL 20.25 MG/1.25GM (1.62%) TD GEL: TRANSDERMAL | 2 refills | Status: DC

## 2016-03-13 NOTE — Telephone Encounter (Signed)
Called patient and gave lab results. Patient had no questions or concerns. Printed out RX for patient to start Androgel again. Patient agreed. Has no other questions and got patient scheduled for labs in 2 months.

## 2016-05-19 ENCOUNTER — Other Ambulatory Visit: Payer: Managed Care, Other (non HMO)

## 2016-05-28 ENCOUNTER — Other Ambulatory Visit: Payer: Managed Care, Other (non HMO)

## 2016-06-14 ENCOUNTER — Emergency Department (HOSPITAL_COMMUNITY)
Admission: EM | Admit: 2016-06-14 | Discharge: 2016-06-14 | Disposition: A | Discharge status: Home / Self Care | Payer: Managed Care, Other (non HMO) | Attending: Physician Assistant | Admitting: Physician Assistant

## 2016-06-14 ENCOUNTER — Encounter (HOSPITAL_COMMUNITY): Payer: Self-pay | Admitting: Emergency Medicine

## 2016-06-14 ENCOUNTER — Emergency Department (HOSPITAL_COMMUNITY): Payer: Managed Care, Other (non HMO)

## 2016-06-14 DIAGNOSIS — Y999 Unspecified external cause status: Secondary | ICD-10-CM | POA: Diagnosis not present

## 2016-06-14 DIAGNOSIS — W000XXA Fall on same level due to ice and snow, initial encounter: Secondary | ICD-10-CM | POA: Insufficient documentation

## 2016-06-14 DIAGNOSIS — S22089A Unspecified fracture of T11-T12 vertebra, initial encounter for closed fracture: Secondary | ICD-10-CM | POA: Diagnosis not present

## 2016-06-14 DIAGNOSIS — S22088A Other fracture of T11-T12 vertebra, initial encounter for closed fracture: Secondary | ICD-10-CM

## 2016-06-14 DIAGNOSIS — S24104A Unspecified injury at T11-T12 level of thoracic spinal cord, initial encounter: Secondary | ICD-10-CM | POA: Diagnosis present

## 2016-06-14 DIAGNOSIS — Y939 Activity, unspecified: Secondary | ICD-10-CM | POA: Insufficient documentation

## 2016-06-14 DIAGNOSIS — Z79899 Other long term (current) drug therapy: Secondary | ICD-10-CM | POA: Diagnosis not present

## 2016-06-14 DIAGNOSIS — E039 Hypothyroidism, unspecified: Secondary | ICD-10-CM | POA: Insufficient documentation

## 2016-06-14 DIAGNOSIS — Y929 Unspecified place or not applicable: Secondary | ICD-10-CM | POA: Insufficient documentation

## 2016-06-14 MED ORDER — OXYCODONE-ACETAMINOPHEN 5-325 MG PO TABS
1.0000 | ORAL_TABLET | Freq: Once | ORAL | Status: AC
Start: 2016-06-14 — End: 2016-06-14
  Administered 2016-06-14: 1 via ORAL
  Filled 2016-06-14: qty 1

## 2016-06-14 MED ORDER — OXYCODONE-ACETAMINOPHEN 5-325 MG PO TABS
1.0000 | ORAL_TABLET | Freq: Once | ORAL | Status: AC
  Administered 2016-06-14: 1 via ORAL
  Filled 2016-06-14: qty 1

## 2016-06-14 MED ORDER — OXYCODONE-ACETAMINOPHEN 5-325 MG PO TABS: 1.0000 | ORAL_TABLET | Freq: Four times a day (QID) | ORAL | 0 refills | Status: AC | PRN

## 2016-06-14 NOTE — ED Notes (Signed)
Rep at bedside applying brace

## 2016-06-14 NOTE — ED Notes (Signed)
Pt and family verbalize understanding of d/c instructions.  Pt advised of risk for constipation with narcotic use. Pt taken to waiting area by EMT

## 2016-06-14 NOTE — ED Provider Notes (Addendum)
[facility]-EMERGENCY DEPT Provider Note   CSN: [id number] Arrival date & time: 06/14/16  1340  By signing my name below, I, Alyssa GroveMartin Green, attest that this documentation has been prepared under the direction and in the presence of Deysy Schabel Randall AnLyn Brooklee Michelin, MD. Electronically Signed: Alyssa GroveMartin Green, ED Scribe. 06/14/16. 3:32 PM.   History   Chief Complaint Chief Complaint  Patient presents with  . Fall   The history is provided by the patient. No language interpreter was used.   HPI Comments: Zachary Aguirre is a 56 y.o. male who presents to the Emergency Department complaining of constant, unchanged, moderate lower back pain s/p fall on ice this morning. He states pain radiates from the center out to the sides. He has not experienced this back pain before. Pt is ambulatory. Pt denies IV drug usage. He denies numbness, tingling, weakness, bowel or bladder incontinence.  Past Medical History:  Diagnosis Date  . Depression   . Environmental allergies   . GERD (gastroesophageal reflux disease)   . Raynaud disease   . Thyroid disease     Patient Active Problem List   Diagnosis Date Noted  . Hypogonadism, male 05/02/2015  . Excessive sweating 05/02/2015  . Hypothyroidism 06/03/2010  . HYPERLIPIDEMIA 06/03/2010  . DEPRESSION 06/03/2010  . GERD 06/03/2010  . RIB PAIN, RIGHT SIDED 06/03/2010    Past Surgical History:  Procedure Laterality Date  . EYE SURGERY    . HERNIA REPAIR    . TONSILLECTOMY       Home Medications    Prior to Admission medications   Medication Sig Start Date End Date Taking? Authorizing Provider  albuterol (PROVENTIL HFA;VENTOLIN HFA) 108 (90 BASE) MCG/ACT inhaler Inhale 1-2 puffs into the lungs every 6 (six) hours as needed for wheezing or shortness of breath. 03/21/15   Junius FinnerErin O'Malley, PA-C  ANDROGEL 20.25 MG/1.25GM (1.62%) GEL Use 1 pump daily 03/13/16   Carlus Pavlovristina Gherghe, MD  ANDROGEL PUMP 20.25 MG/ACT (1.62%) GEL apply TWO pumps EVERY DAY 02/18/15   Historical  Provider, MD  buPROPion (WELLBUTRIN XL) 300 MG 24 hr tablet Take 300 mg by mouth daily.    Historical Provider, MD  Escitalopram Oxalate (LEXAPRO PO) Take 10 mg by mouth daily.     Historical Provider, MD  fluticasone (FLONASE) 50 MCG/ACT nasal spray Place 2 sprays into the nose daily.    Historical Provider, MD  Fluticasone-Salmeterol (ADVAIR) 250-50 MCG/DOSE AEPB Inhale 1 puff into the lungs every 12 (twelve) hours.    Historical Provider, MD  levothyroxine (SYNTHROID, LEVOTHROID) 25 MCG tablet Take 25 mcg by mouth daily.    Historical Provider, MD  omeprazole (PRILOSEC) 40 MG capsule Take 40 mg by mouth daily.    Historical Provider, MD  oxybutynin (DITROPAN) 5 MG tablet Take 5 mg by mouth 3 (three) times daily.    Historical Provider, MD  oxyCODONE-acetaminophen (PERCOCET/ROXICET) 5-325 MG tablet Take 1 tablet by mouth every 6 (six) hours as needed for severe pain. 06/14/16   Anabeth Chilcott Lyn Christine Schiefelbein, MD  predniSONE (DELTASONE) 20 MG tablet 3 tabs po day one, then 2 po daily x 4 days 03/21/15   Junius FinnerErin O'Malley, PA-C    Family History History reviewed. No pertinent family history.  Social History Social History  Substance Use Topics  . Smoking status: Never Smoker  . Smokeless tobacco: Never Used  . Alcohol use Yes     Comment: weekly     Allergies   Ibuprofen and Molds & smuts   Review of Systems Review of  Systems  Genitourinary:       - Bowel or bladder incontinence  Musculoskeletal: Positive for back pain.  Neurological: Negative for weakness and numbness.     Physical Exam Updated Vital Signs BP 131/82 (BP Location: Right Arm)   Pulse 84   Temp 98.1 F (36.7 C) (Oral)   Resp 16   Ht 5\' 8"  (1.727 m)   Wt 210 lb (95.3 kg)   SpO2 94%   BMI 31.93 kg/m   Physical Exam  Constitutional: He is oriented to person, place, and time. He appears well-nourished.  HENT:  Head: Normocephalic.  Mouth/Throat: Oropharynx is clear and moist.  Eyes: Conjunctivae are normal.    Neck: No tracheal deviation present.  Cardiovascular: Normal rate.   Pulmonary/Chest: Effort normal. No stridor. No respiratory distress.  Abdominal: Soft. There is no tenderness. There is no guarding.  Musculoskeletal: Normal range of motion. He exhibits no edema.  Pain to L spine,   Neurological: He is oriented to person, place, and time. No cranial nerve deficit.  Normal legs bialteral, nl sternght, sensation. No trauma.   Skin: Skin is warm and dry. No rash noted. He is not diaphoretic.  Psychiatric: He has a normal mood and affect. His behavior is normal.  Nursing note and vitals reviewed.    ED Treatments / Results  DIAGNOSTIC STUDIES: Oxygen Saturation is 98% on RA, normal by my interpretation.    COORDINATION OF CARE: 3:29 PM Discussed treatment plan with pt at bedside which includes Lumbar Spine XR and pt agreed to plan.  Labs (all labs ordered are listed, but only abnormal results are displayed) Labs Reviewed - No data to display  EKG  EKG Interpretation None       Radiology Dg Lumbar Spine Complete  Result Date: 06/14/2016 CLINICAL DATA:  Status post fall. EXAM: LUMBAR SPINE - COMPLETE 4+ VIEW COMPARISON:  None. FINDINGS: T12 vertebral body compression fracture with approximately 40% height loss anteriorly. Remainder the vertebral body heights are maintained. Alignment is normal. Intervertebral disc spaces are maintained. IMPRESSION: Acute T12 vertebral body compression fracture with approximately 40% height loss. Electronically Signed   By: Elige Ko   On: 06/14/2016 17:00   Ct Thoracic Spine Wo Contrast  Result Date: 06/14/2016 CLINICAL DATA:  Sub some nights.  T12 fracture EXAM: CT THORACIC AND LUMBAR SPINE WITHOUT CONTRAST TECHNIQUE: Multidetector CT imaging of the thoracic and lumbar spine was performed without contrast. Multiplanar CT image reconstructions were also generated. COMPARISON:  Plain film 06/14/2016 FINDINGS: CT THORACIC SPINE FINDINGS  Alignment: Normal Vertebrae: Fracture the T12 vertebral body involving predominantly the superior endplate and the anterior wall with 15 to 20% loss vertebral body height anteriorly and and 5 to 10% posteriorly. No retropulsion. The pedicles appear normal. The neural arches are normal. No significant paraspinal hematoma. No epidural hematoma. No additional fracture evident in the more superior thoracic vertebral bodies appear CT LUMBAR SPINE FINDINGS Segmentation: Normal Alignment: Normal Vertebrae: No fracture or subluxation Paraspinal and other soft tissues: No epidural paraspinal hematoma. Disc levels: Unremarkable IMPRESSION: CT THORACIC SPINE IMPRESSION 1. Compression fracture of the T12 vertebral body involving the superior endplate and anterior wall as described above. No retropulsion. No involvement of the pedicles or neural arch. 2. No epidural or paraspinal hematoma. CT LUMBAR SPINE IMPRESSION No lumbar spine fracture. Electronically Signed   By: Genevive Bi M.D.   On: 06/14/2016 18:38   Ct Lumbar Spine Wo Contrast  Result Date: 06/14/2016 CLINICAL DATA:  Sub some nights.  T12 fracture EXAM: CT THORACIC AND LUMBAR SPINE WITHOUT CONTRAST TECHNIQUE: Multidetector CT imaging of the thoracic and lumbar spine was performed without contrast. Multiplanar CT image reconstructions were also generated. COMPARISON:  Plain film 06/14/2016 FINDINGS: CT THORACIC SPINE FINDINGS Alignment: Normal Vertebrae: Fracture the T12 vertebral body involving predominantly the superior endplate and the anterior wall with 15 to 20% loss vertebral body height anteriorly and and 5 to 10% posteriorly. No retropulsion. The pedicles appear normal. The neural arches are normal. No significant paraspinal hematoma. No epidural hematoma. No additional fracture evident in the more superior thoracic vertebral bodies appear CT LUMBAR SPINE FINDINGS Segmentation: Normal Alignment: Normal Vertebrae: No fracture or subluxation Paraspinal  and other soft tissues: No epidural paraspinal hematoma. Disc levels: Unremarkable IMPRESSION: CT THORACIC SPINE IMPRESSION 1. Compression fracture of the T12 vertebral body involving the superior endplate and anterior wall as described above. No retropulsion. No involvement of the pedicles or neural arch. 2. No epidural or paraspinal hematoma. CT LUMBAR SPINE IMPRESSION No lumbar spine fracture. Electronically Signed   By: Genevive Bi M.D.   On: 06/14/2016 18:38    Procedures Procedures (including critical care time)  Medications Ordered in ED Medications  oxyCODONE-acetaminophen (PERCOCET/ROXICET) 5-325 MG per tablet 1 tablet (1 tablet Oral Given 06/14/16 1811)  oxyCODONE-acetaminophen (PERCOCET/ROXICET) 5-325 MG per tablet 1 tablet (1 tablet Oral Given 06/14/16 2029)     Initial Impression / Assessment and Plan / ED Course  I have reviewed the triage vital signs and the nursing notes.  Pertinent labs & imaging results that were available during my care of the patient were reviewed by me and considered in my medical decision making (see chart for details).     I personally performed the services described in this documentation, which was scribed in my presence. The recorded information has been reviewed and is accurate.    Patient is a 56 year old male presenting after fall. Patient slipped on ice and fell onto his lumbar spine. Patient has pain over the lumbar spine. Neurovascularly intact distally, good strength bilateral lower extremities. No red flags.  We'll get x-ray and then treat symptomatically.  8:54 PM X-ray showed 40% loss on T12 fracture. CT done. CT shows more like 10% to 15%. Discussed with Dr. Venetia Maxon, neurosurgeon. We will administer TLSO brace and have him follow-up as an outpatient.  Patient is comfortable, ambulatory, and taking PO at time of discharge.  Patient expressed understanding about return precautions.     Final Clinical Impressions(s) / ED Diagnoses     Final diagnoses:  Other closed fracture of twelfth thoracic vertebra, initial encounter (HCC)    New Prescriptions New Prescriptions   OXYCODONE-ACETAMINOPHEN (PERCOCET/ROXICET) 5-325 MG TABLET    Take 1 tablet by mouth every 6 (six) hours as needed for severe pain.     Andoni Busch Randall An, MD 06/14/16 1549    Lizzete Gough Randall An, MD 06/14/16 1959    Kalaysia Demonbreun Randall An, MD 06/14/16 1610    Sharel Behne Randall An, MD 06/14/16 9604    Akshaj Besancon Randall An, MD 06/24/16 5409

## 2016-06-14 NOTE — Progress Notes (Signed)
Orthopedic Tech Progress Note Patient Details:  Hilton CorkMark A Nunn 1961/02/05 [id number] Called brace order in to [employer] Patient ID: Hilton CorkMark A Joss, male   DOB: 1961/02/05, 56 y.o.   MRN: [id number]   Jennye MoccasinHughes, Gesselle Fitzsimons Craig 06/14/2016, 8:12 PM

## 2016-06-14 NOTE — Discharge Instructions (Signed)
You were seen today after fall and found to have a fracture in your spine. Please use pain medication to help with pain. Please stay in the brace until your follow-up with the neurosurgeon.

## 2016-06-14 NOTE — ED Notes (Signed)
Dr. Mackuen at bedside  

## 2016-06-14 NOTE — Progress Notes (Signed)
Orthopedic Tech Progress Note Patient Details:  Zachary Aguirre 1960/09/18 [id number] Brace order completed by [employer]. Patient ID: Zachary Aguirre, male   DOB: 1960/09/18, 56 y.o.   MRN: [id number]   Zachary Aguirre, Zachary Aguirre 06/14/2016, 9:40 PM

## 2016-06-14 NOTE — ED Notes (Signed)
Pt and family made aware of delay waiting for vendor to apply brace. Ortho has been in to eval patient. Pt medicated for pain. Call bell within reach

## 2016-06-14 NOTE — ED Triage Notes (Signed)
Slipped on ice under foot and fell landing on back.  Pain from lower T-spine upper Lumbar area.  Pain radiates across back.  Able to walk slowly.  Has never had this type of back pain in past. Denies any other symptoms.

## 2016-09-10 ENCOUNTER — Telehealth: Payer: Self-pay | Admitting: Internal Medicine

## 2016-09-10 ENCOUNTER — Ambulatory Visit: Payer: Managed Care, Other (non HMO) | Admitting: Internal Medicine

## 2016-09-10 NOTE — Telephone Encounter (Signed)
..  lb

## 2016-09-10 NOTE — Telephone Encounter (Signed)
Patient no showed today's appt. Please advise on how to follow up. °A. No follow up necessary. °B. Follow up urgent. Contact patient immediately. °C. Follow up necessary. Contact patient and schedule visit in ___ days. °D. Follow up advised. Contact patient and schedule visit in ____weeks. ° °

## 2016-09-10 NOTE — Telephone Encounter (Signed)
6 mo 

## 2016-09-14 ENCOUNTER — Telehealth: Payer: Self-pay

## 2016-09-14 ENCOUNTER — Encounter: Payer: Self-pay | Admitting: Internal Medicine

## 2016-09-14 ENCOUNTER — Ambulatory Visit (INDEPENDENT_AMBULATORY_CARE_PROVIDER_SITE_OTHER): Payer: Managed Care, Other (non HMO) | Admitting: Internal Medicine

## 2016-09-14 VITALS — BP 130/82 | HR 60 | Ht 66.0 in | Wt 220.0 lb

## 2016-09-14 DIAGNOSIS — S22088D Other fracture of T11-T12 vertebra, subsequent encounter for fracture with routine healing: Secondary | ICD-10-CM

## 2016-09-14 DIAGNOSIS — E039 Hypothyroidism, unspecified: Secondary | ICD-10-CM

## 2016-09-14 DIAGNOSIS — E291 Testicular hypofunction: Secondary | ICD-10-CM | POA: Diagnosis not present

## 2016-09-14 LAB — T4, FREE: Free T4: 0.71 ng/dL (ref 0.60–1.60)

## 2016-09-14 LAB — VITAMIN D 25 HYDROXY (VIT D DEFICIENCY, FRACTURES): VITD: 27.95 ng/mL — AB (ref 30.00–100.00)

## 2016-09-14 LAB — TSH: TSH: 3.64 u[IU]/mL (ref 0.35–4.50)

## 2016-09-14 MED ORDER — ANDROGEL 20.25 MG/1.25GM (1.62%) TD GEL: TRANSDERMAL | 2 refills | Status: DC

## 2016-09-14 MED ORDER — LEVOTHYROXINE SODIUM 50 MCG PO TABS: 25.0000 ug | ORAL_TABLET | Freq: Every day | ORAL | 3 refills | Status: DC

## 2016-09-14 NOTE — Patient Instructions (Addendum)
Please stop at the lab.  Please start testosterone 1 pump daily.  Come back in 2 months for labs. Come fasting, at 8 am.  Please continue levothyroxine 50 mcg daily.  Take the thyroid hormone every day, with water, at least 30 minutes before breakfast, separated by at least 4 hours from: - acid reflux medications - calcium - iron - multivitamins  Please come back for a follow-up appointment in 6 months.

## 2016-09-14 NOTE — Progress Notes (Addendum)
Patient ID: Zachary Aguirre, male   DOB: 1960-11-26, 56 y.o.   MRN: [id number]  HPI: Zachary Aguirre is a 56 y.o.-year-old man, initially referred by his PCP, Dr. 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 Last visit 6 mo ago.  Since last visit, he fell on ice on his driveway >> T12 compression fx. In 05/2016. He had a brace >> now off since yesterday.  Reviewed and addended hx: He was diagnosed with hypogonadism in Spring 2016, during investigation for sweating and weight gain. He was started on testosterone 1.62% AndroGel >> 4 pumps daily.   Per review of PCPs office visit notes, the AndroGel dose was increased in 09/2014, as a testosterone level was low at that time at 141.   After this increase in dose, a  testosterone level from 02/04/2015 was normal, 394.6 [phone]). He started to feel a little better after he increased testosterone.   Of note, he still has excessive sweating (better in winter 2017-2018), and uses Ditropan 5 mg twice a day for this. He is on omeprazole.  Reviewed pertinent hx: He admits for decreased libido. Has difficulty obtaining or maintaining an erection >> only a little better. Tried Viagra >> worked, but HA the second time he used it. Did not see urology. No trauma to testes, testicular irradiation or surgery No h/o of mumps orchitis/but has h/o autoimmune ds.(vitiligo) No h/o cryptorchidism. He grew and went through puberty like his peers. No shrinking of testes. No very small testes (<5 ml).    No breast discomfort/gynecomastia    No loss of body hair (axillary/pubic)/decreased need for shaving. + height loss. No abnormal sense of smell (only allergies). ? hot flushes No vision problems. No worst HA of his life. No FH of hypogonadism/infertility  No personal h/o infertility - has 4 children No FH of hemochromatosis or pituitary tumors + weight gain (35 lbs in last 2 year) - cut out snacks No chronic diseases No  chronic pain. Not on opiates, had steroid inj in spine - 2005. Had Prednisone tapers for SOB. No more than 2 drinks a day of alcohol at a time, and this is rarely No anabolic steroids use. No herbal medicines. He is on bupropion. He continues Lexapro. ? family history of early cardiac disease.  He also has a history of elevated AST and ALT.  Reviewed prev. Labs - normal pituitary hh and other pertinent labs (except low T): Component     Latest Ref Rng & Units 07/23/2015  Testosterone     250 - 827 ng/dL 409 (L)  Sex Horm Binding Glob, Serum     10 - 50 nmol/L 19  Testosterone Free     47.0 - 244.0 pg/mL 39.8 (L)  Testosterone-% Free     1.6 - 2.9 % 2.5  Iron     42 - 165 ug/dL 70  Transferrin     811.9 - 360.0 mg/dL 147.8  Saturation Ratios     20.0 - 50.0 % 20.6  IGF-I, LC/MS     50 - 317 ng/mL 71  Z-Score (Male)     -2.0 - 2.0 SD -1.3  Estradiol, Free     ADULTS: < OR = 0.45 pg/mL 0.45  Estradiol     ADULTS: < OR = 29 pg/mL 22  FSH     1.4 - 18.1 mIU/ML 5.9  Beta hCG, Tumor Marker     <5.0 mIU/mL < 2.0  Prolactin     2.0 -  18.0 ng/mL 3.9  PSA     0.10 - 4.00 ng/mL 0.30  Cortisol, Plasma     ug/dL 6.8  V409 ACTH     6 - 50 pg/mL 20  Hemoglobin A1C     4.6 - 6.5 % 6.1  We wanted to check a pituitary MRI >> not covered by insurance.  He restarted testosterone gel - 2 pumps a day 08/2015 >> but again stopped after the following labs in 11/2015: 12/04/2015: Total testosterone 211 ([id number]) - 10:37 am Did not feel different off testosterone.   However, as Testosterone was again low at last OV >> I suggested to restart Testosterone at a lower dose, 1 pump daily  Component     Latest Ref Rng & Units 03/12/2016  Testosterone     264 - 916 ng/dL 811 (L)  Sex Horm Binding Glob, Serum     19.3 - 76.4 nmol/L 24.7  Testosterone Free     7.2 - 24.0 pg/mL 5.0 (L)  T4,Free(Direct)     0.60 - 1.60 ng/dL 9.14   Hypothyroidism: - dx 2016 - He takes the  levothyroxine: Fasting With water Eats breakfast more than 30 minutes later Takes omeprazole bedtime Takes multivitamins bedtime No calcium, iron.  Latest TFTs: Lab Results  Component Value Date   TSH 1.73 03/12/2016   TSH 3.81 07/23/2015   FREET4 0.81 03/12/2016   FREET4 0.83 07/23/2015    ROS: Constitutional: + weight gain, + fatigue, + subjective hyperthermia, + poor sleep Eyes: no blurry vision, no xerophthalmia ENT: no sore throat, no nodules palpated in throat, no dysphagia/odynophagia, no hoarseness Cardiovascular: no CP/SOB/palpitations/leg swelling Respiratory: no cough/SOB Gastrointestinal: no N/V/D/C Musculoskeletal: no muscle/joint aches Skin: no rashes Neurological: no tremors/numbness/tingling/dizziness + nonexistent libido  I reviewed pt's medications, allergies, PMH, social hx, family hx, and changes were documented in the history of present illness. Otherwise, unchanged from my initial visit note.  Past Medical History:  Diagnosis Date  . Depression   . Environmental allergies   . GERD (gastroesophageal reflux disease)   . Raynaud disease   . Thyroid disease    Past Surgical History:  Procedure Laterality Date  . EYE SURGERY    . HERNIA REPAIR    . TONSILLECTOMY     Social History   Social History  . Marital Status: Married    Spouse Name: N/A  . Number of Children: 4   Occupational History  . librarian   Social History Main Topics  . Smoking status: Never Smoker   . Smokeless tobacco: Never Used  . Alcohol Use: Yes, wine, beer - socially  . Drug Use: No   Current Outpatient Prescriptions on File Prior to Visit  Medication Sig Dispense Refill  . albuterol (PROVENTIL HFA;VENTOLIN HFA) 108 (90 BASE) MCG/ACT inhaler Inhale 1-2 puffs into the lungs every 6 (six) hours as needed for wheezing or shortness of breath. 1 Inhaler 0  . ANDROGEL 20.25 MG/1.25GM (1.62%) GEL Use 1 pump daily 75 g 2  . ANDROGEL PUMP 20.25 MG/ACT (1.62%) GEL apply TWO  pumps EVERY DAY  5  . buPROPion (WELLBUTRIN XL) 300 MG 24 hr tablet Take 300 mg by mouth daily.    . Escitalopram Oxalate (LEXAPRO PO) Take 10 mg by mouth daily.     . fluticasone (FLONASE) 50 MCG/ACT nasal spray Place 2 sprays into the nose daily.    . Fluticasone-Salmeterol (ADVAIR) 250-50 MCG/DOSE AEPB Inhale 1 puff into the lungs every 12 (twelve) hours.    Marland Kitchen  levothyroxine (SYNTHROID, LEVOTHROID) 50 MCG tablet Take 50 mcg by mouth daily.    Marland Kitchen omeprazole (PRILOSEC) 40 MG capsule Take 40 mg by mouth daily.    Marland Kitchen oxybutynin (DITROPAN) 5 MG tablet Take 5 mg by mouth 3 (three) times daily.    Marland Kitchen oxyCODONE-acetaminophen (PERCOCET/ROXICET) 5-325 MG tablet Take 1 tablet by mouth every 6 (six) hours as needed for severe pain. 15 tablet 0  . predniSONE (DELTASONE) 20 MG tablet 3 tabs po day one, then 2 po daily x 4 days 11 tablet 0   No current facility-administered medications on file prior to visit.    Allergies  Allergen Reactions  . Ibuprofen Other (See Comments)  . Molds & Smuts    No family history on file. patient is adopted.  PE: BP 130/82 (BP Location: Left Arm, Patient Position: Sitting)   Pulse 60   Ht  (1.676 m)   Wt 220 lb (99.8 kg)   BMI 35.51 kg/m  Wt Readings from Last 3 Encounters:  09/14/16 220 lb (99.8 kg)  06/14/16 210 lb (95.3 kg)  03/12/16 207 lb (93.9 kg)   Constitutional: overweight, in NAD Eyes: PERRLA, EOMI, no exophthalmos ENT: moist mucous membranes, no thyromegaly, no cervical lymphadenopathy Cardiovascular: RRR, No MRG Respiratory: CTA B Gastrointestinal: abdomen soft, NT, ND, BS+ Musculoskeletal: no deformities, strength intact in all 4 Skin: moist, warm, no rashes Neurological: no tremor with outstretched hands, DTR normal in all 4  ASSESSMENT: 1. Hypogonadotropic Hypogonadism - Initially started testosterone replacement in Spring 2016 - I do not have a clear reason for his hypogonadism. Possible etiologies:  Drugs (he is on Lexapro and had  steroid injections in the past, but not in the last 10 years per his report; he has not used anabolic steroids in the past that is not using illicit drugs now)  Pituitary tumor - I doubt this in the setting of normal pituitary workup (except testosterone) - an MRI was denied by his insurance...  Chronic conditions (he does not have diabetes, severe hyperlipidemia; he does have elevated transaminases...)  Weight gain (he has had quite a bit of weight gain over the years which can cause hypogonadism)  Obstructive sleep apnea (no history of this)  Age-related (possibly)  Hemochromatosis (IBC panel was normal)   Testicular pathology including cancer (Beta hCG and estradiol were normal)   2. Hypothyroidism  3. T12 compression fx  PLAN:  1. Hypogonadotropic Hypogonadism - Patient with history of hypogonadism, which was diagnosed during investigation for excessive sweating and weight gain.  At last visit, his testosterone level was low, and he still had low libido and problems with erections. He mentions that he stopped testosterone (stopped from 08/2015 to 11/2015) since he thought that he did not see significant improvement in his symptoms on the testosterone replacement. However, since the symptoms were still present, he was interesting to restart it. I did suggest for him to restart testosterone - At this visit, he tells me that after his compression fracture in 05/2016 he stopped testosterone because he could not apply it on his shoulders. He has been off for the last 3 months. He would like to start back on it and needs a prescription (done) - We will start with 1 pump of his 1.62% AndroGel daily -  I advised him to come back in 2 months for another testosterone level check  - I will see him back in 6 months and at that time I plan to check a PSA and a  CBC along with a testosterone level  - I also advised him to have annual rectal exams by PCP or urologist  2. Hypothyroidism - continues  on levothyroxine 50 g, does not skip doses - I  again advised him to take the thyroid hormone every day, with water, >30 minutes before breakfast, separated by >4 hours from: - acid reflux medications - calcium - iron - he is taking it correctly now. -  We'll recheck his thyroid tests today  3.  T12 vertebral fracture  -  this is healing, but he still has pain  - I strongly advised him to start testosterone replacement, since hypogonadism is a risk for osteoporosis  - I would also like to check a DEXA scan and a vitamin D level. He is currently on a multivitamin daily.   Office Visit on 09/14/2016  Component Date Value Ref Range Status  . TSH 09/14/2016 3.64  0.35 - 4.50 uIU/mL Final  . Free T4 09/14/2016 0.71  0.60 - 1.60 ng/dL Final   Comment: Specimens from patients who are undergoing biotin therapy and /or ingesting biotin supplements may contain high levels of biotin.  The higher biotin concentration in these specimens interferes with this Free T4 assay.  Specimens that contain high levels  of biotin may cause false high results for this Free T4 assay.  Please interpret results in light of the total clinical presentation of the patient.    Marland Kitchen VITD 09/14/2016 27.95* 30.00 - 100.00 ng/mL Final   Vit D slightly low >> start a Supplement of 2000 units OTC vitamin D3 daily. TFTs are OK. Will add a level to the labs in 2 mo.  Results:  Lumbar spine (L1-L4) Femoral neck (FN)  T-score -2.7 RFN: -2.1 LFN: -2.5   New dx of OP. Will d/w pt to start Fosamax.  Carlus Pavlov, MD PhD [facility]

## 2016-09-14 NOTE — Telephone Encounter (Signed)
Called and advised patient of DEXA scan scheduled. Patient understood and had no questions at this time.

## 2016-09-15 NOTE — Telephone Encounter (Signed)
-----   Message from Carlus Pavlov, MD sent at 09/14/2016  5:32 PM EDT ----- [name], can you please call pt: vit D slightly low >> start a Supplement of 2000 units OTC vitamin D3 daily. TFTs are OK.

## 2016-09-15 NOTE — Telephone Encounter (Signed)
Called patient and gave lab results. Patient had no questions or concerns.  

## 2016-09-22 ENCOUNTER — Inpatient Hospital Stay: Admission: RE | Admit: 2016-09-22 | Payer: Managed Care, Other (non HMO) | Source: Ambulatory Visit

## 2016-09-29 ENCOUNTER — Ambulatory Visit (INDEPENDENT_AMBULATORY_CARE_PROVIDER_SITE_OTHER)
Admission: RE | Admit: 2016-09-29 | Discharge: 2016-09-29 | Disposition: A | Discharge status: Home / Self Care | Payer: Managed Care, Other (non HMO) | Source: Ambulatory Visit | Attending: Internal Medicine | Admitting: Internal Medicine

## 2016-09-29 DIAGNOSIS — S22088D Other fracture of T11-T12 vertebra, subsequent encounter for fracture with routine healing: Secondary | ICD-10-CM

## 2016-10-02 ENCOUNTER — Telehealth: Payer: Self-pay

## 2016-10-02 NOTE — Telephone Encounter (Signed)
-----   Message from Carlus Pavlovristina Gherghe, MD sent at 10/02/2016 12:31 PM EDT ----- Raynelle FanningJulie, can you please call pt: The bone density scan shows osteoporosis at both spine and L hip and low bone mineral density in R hip. He should increase the vitamin D supplements discussed at last visit and also start back testosterone as we discussed. However, I would also recommend Fosamax which is taken once a week, which is a medication for osteoporosis. Would he agree to start this? If so, he needs to take it with a full glass of water and not related down for an hour afterwards to make sure it reaches his stomach.

## 2016-10-02 NOTE — Telephone Encounter (Signed)
Attempted to contact patient, no answer. Will try again later.

## 2016-10-05 ENCOUNTER — Other Ambulatory Visit: Payer: Self-pay | Admitting: Internal Medicine

## 2016-10-05 ENCOUNTER — Telehealth: Payer: Self-pay

## 2016-10-05 MED ORDER — ALENDRONATE SODIUM 70 MG PO TABS: 70.0000 mg | ORAL_TABLET | ORAL | 3 refills | Status: DC

## 2016-10-05 NOTE — Telephone Encounter (Signed)
Called and advised of bone density results. Patient agrees to increase Vitamin D, and to start back on Testosterone, he also agreed to take Fosamax, we discussed how to take the medication as well. Patient had no questions at this time.

## 2016-10-05 NOTE — Telephone Encounter (Signed)
-----   Message from Cristina Gherghe, MD sent at 10/02/2016 12:31 PM EDT ----- Zyla Dascenzo, can you please call pt: The bone density scan shows osteoporosis at both spine and L hip and low bone mineral density in R hip. He should increase the vitamin D supplements discussed at last visit and also start back testosterone as we discussed. However, I would also recommend Fosamax which is taken once a week, which is a medication for osteoporosis. Would he agree to start this? If so, he needs to take it with a full glass of water and not related down for an hour afterwards to make sure it reaches his stomach. 

## 2016-10-22 ENCOUNTER — Telehealth: Payer: Self-pay | Admitting: Family Medicine

## 2016-10-22 NOTE — Telephone Encounter (Signed)
Patient is sweating a lot and wants to know if he can up his pump dose from one to two?  Please call back to discuss.  **Remind patient they can make refill requests via MyChart**  Medication refill request (Name & Dosage):  ANDROGEL 20.25 MG/1.25GM (1.62%) GEL  Preferred pharmacy (Name & Address):   [facility] [place] [address]  Other comments (if applicable):   Script expired.

## 2016-11-17 ENCOUNTER — Other Ambulatory Visit: Payer: Self-pay

## 2016-11-17 ENCOUNTER — Telehealth: Payer: Self-pay

## 2016-11-17 ENCOUNTER — Other Ambulatory Visit (INDEPENDENT_AMBULATORY_CARE_PROVIDER_SITE_OTHER): Payer: Managed Care, Other (non HMO)

## 2016-11-17 DIAGNOSIS — S22088D Other fracture of T11-T12 vertebra, subsequent encounter for fracture with routine healing: Secondary | ICD-10-CM | POA: Diagnosis not present

## 2016-11-17 LAB — VITAMIN D 25 HYDROXY (VIT D DEFICIENCY, FRACTURES): VITD: 34.72 ng/mL (ref 30.00–100.00)

## 2016-11-17 MED ORDER — ANDROGEL 20.25 MG/1.25GM (1.62%) TD GEL: TRANSDERMAL | 2 refills | Status: DC

## 2016-11-17 NOTE — Telephone Encounter (Signed)
Printed, will stamp and submit to pharmacy.

## 2016-11-17 NOTE — Telephone Encounter (Signed)
Patient is here for labs today and is requesting his prescription be filled for Androgel

## 2016-11-30 ENCOUNTER — Other Ambulatory Visit: Payer: Self-pay

## 2016-11-30 ENCOUNTER — Telehealth: Payer: Self-pay

## 2016-11-30 MED ORDER — ANDROGEL 20.25 MG/1.25GM (1.62%) TD GEL: TRANSDERMAL | 2 refills | Status: DC

## 2016-11-30 NOTE — Telephone Encounter (Signed)
-----   Message from Zachary Pavlovristina Gherghe, MD sent at 11/30/2016  1:05 PM EDT ----- Raynelle FanningJulie, can you please call pt: Patient's vitamin D level has normalized. Let's stay on the current dose of 2000 units daily of the vitamin D supplement. Try not to forget to take it every day.

## 2016-11-30 NOTE — Telephone Encounter (Signed)
Called patient and gave lab results. Patient had no questions or concerns.  

## 2016-12-08 ENCOUNTER — Other Ambulatory Visit: Payer: Self-pay

## 2016-12-08 MED ORDER — ANDROGEL 20.25 MG/1.25GM (1.62%) TD GEL: TRANSDERMAL | 2 refills | Status: DC

## 2017-03-16 ENCOUNTER — Ambulatory Visit: Payer: Managed Care, Other (non HMO) | Admitting: Internal Medicine

## 2017-05-20 ENCOUNTER — Ambulatory Visit: Payer: Managed Care, Other (non HMO) | Admitting: Internal Medicine

## 2017-07-13 ENCOUNTER — Ambulatory Visit: Payer: Managed Care, Other (non HMO) | Admitting: Internal Medicine

## 2017-07-13 ENCOUNTER — Encounter: Payer: Self-pay | Admitting: Internal Medicine

## 2017-07-13 VITALS — BP 124/92 | HR 82 | Ht 66.0 in | Wt 217.8 lb

## 2017-07-13 DIAGNOSIS — E039 Hypothyroidism, unspecified: Secondary | ICD-10-CM | POA: Diagnosis not present

## 2017-07-13 DIAGNOSIS — E559 Vitamin D deficiency, unspecified: Secondary | ICD-10-CM

## 2017-07-13 DIAGNOSIS — E291 Testicular hypofunction: Secondary | ICD-10-CM | POA: Diagnosis not present

## 2017-07-13 LAB — CBC
HEMATOCRIT: 45.1 % (ref 39.0–52.0)
Hemoglobin: 15.3 g/dL (ref 13.0–17.0)
MCHC: 34 g/dL (ref 30.0–36.0)
MCV: 89.3 fl (ref 78.0–100.0)
Platelets: 374 10*3/uL (ref 150.0–400.0)
RBC: 5.05 Mil/uL (ref 4.22–5.81)
RDW: 14 % (ref 11.5–15.5)
WBC: 5.3 10*3/uL (ref 4.0–10.5)

## 2017-07-13 LAB — TSH: TSH: 2.59 u[IU]/mL (ref 0.35–4.50)

## 2017-07-13 LAB — VITAMIN D 25 HYDROXY (VIT D DEFICIENCY, FRACTURES): VITD: 22.63 ng/mL — ABNORMAL LOW (ref 30.00–100.00)

## 2017-07-13 LAB — T4, FREE: Free T4: 0.8 ng/dL (ref 0.60–1.60)

## 2017-07-13 LAB — PSA: PSA: 0.28 ng/mL (ref 0.10–4.00)

## 2017-07-13 MED ORDER — ANDROGEL 20.25 MG/1.25GM (1.62%) TD GEL: TRANSDERMAL | 2 refills | Status: AC

## 2017-07-13 MED ORDER — LEVOTHYROXINE SODIUM 50 MCG PO TABS: 50.0000 ug | ORAL_TABLET | Freq: Every day | ORAL | 3 refills | Status: AC

## 2017-07-13 NOTE — Progress Notes (Addendum)
Patient ID: [name], male   DOB: [birth year], 57 y.o.   MRN: [id number]  HPI: Zachary Aguirre is a 57 y.o.-year-old man, returning for follow-up for hypogonadotropic hypogonadism, osteoporosis, and hypothyroidism. Last visit 10 months ago.  At last visit, he just had a T12 compression fracture after he fell on ice on his driveway in 05/2016.  We checked a bone density scan which showed osteoporosis.    09/29/2016 Lumbar spine (L1-L4) Femoral neck (FN)  T-score -2.7 RFN: -2.1 LFN: -2.5   At that time, he was off his AndroGel for 3 months.  Since he does have a history of hypogonadotropic hypogonadism, we restarted him on testosterone.  Reviewed and addended history: He was diagnosed with hypogonadism in Spring 2016, during investigation for sweating and weight gain. He was started on testosterone 1.62% AndroGel >> 4 pumps daily.   Per review of PCPs office visit notes, the AndroGel dose was increased in 09/2014, as a testosterone level was low at that time at 141.   After this increase in dose, a  testosterone level from 02/04/2015 was normal, 394.6 ([phone]).  He started to feel a little better after he increased testosterone but not significantly so.  Of note, he still has excessive sweating (better in winter 2017-2018), and uses Ditropan 5 mg twice a day for this.  He is on omeprazole.  He admits for decreased libido. Has difficulty obtaining or maintaining an erection >> only a little better. Tried Viagra >> worked, but HA the second time he used it. Did not see urology. No trauma to testes, testicular irradiation or surgery No h/o of mumps orchitis/but has h/o autoimmune ds.(vitiligo) No h/o cryptorchidism. He grew and went through puberty like his peers. No shrinking of testes. No very small testes (<5 ml).    No breast discomfort/gynecomastia    No loss of body hair (axillary/pubic)/decreased need for shaving. + height loss. No abnormal sense of smell (only allergies). ?  hot flushes No vision problems. No worst HA of his life. No FH of hypogonadism/infertility  No personal h/o infertility - has 4 children No FH of hemochromatosis or pituitary tumors + weight gain (35 lbs in last 2 year) - cut out snacks No chronic diseases No chronic pain. Not on opiates, had steroid inj in spine - 2005. Had Prednisone tapers for SOB. No more than 2 drinks a day of alcohol at a time, and this is rarely No anabolic steroids use. No herbal medicines. He is on bupropion. He continues Lexapro. ? family history of early cardiac disease.   He also has a history of elevated AST and ALT  Reviewed previous labs:  Component     Latest Ref Rng & Units 07/23/2015  Testosterone     250 - 827 ng/dL 409 (L)  Sex Horm Binding Glob, Serum     10 - 50 nmol/L 19  Testosterone Free     47.0 - 244.0 pg/mL 39.8 (L)  Testosterone-% Free     1.6 - 2.9 % 2.5  Iron     42 - 165 ug/dL 70  Transferrin     811.9 - 360.0 mg/dL 147.8  Saturation Ratios     20.0 - 50.0 % 20.6  IGF-I, LC/MS     50 - 317 ng/mL 71  Z-Score (Male)     -2.0 - 2.0 SD -1.3  Estradiol, Free     ADULTS: < OR = 0.45 pg/mL 0.45  Estradiol     ADULTS: < OR =  29 pg/mL 22  FSH     1.4 - 18.1 mIU/ML 5.9  Beta hCG, Tumor Marker     <5.0 mIU/mL < 2.0  Prolactin     2.0 - 18.0 ng/mL 3.9  PSA     0.10 - 4.00 ng/mL 0.30  Cortisol, Plasma     ug/dL 6.8  Z610 ACTH     6 - 50 pg/mL 20  Hemoglobin A1C     4.6 - 6.5 % 6.1  We wanted to check a pituitary MRI >> not covered by insurance.   He restarted testosterone gel - 2 pumps a day 08/2015 >> but again stopped after the following labs in 11/2015: 12/04/2015: Total testosterone 211 ([phone]) - 10:37 am  Testosterone was again low in 02/2016 so I suggested to restart the AndroGel, but he did not do so: Component     Latest Ref Rng & Units 03/12/2016  Testosterone     264 - 916 ng/dL 960 (L)  Testosterone Free     7.2 - 24.0 pg/mL 5.0 (L)  Sex Horm  Binding Glob, Serum     19.3 - 76.4 nmol/L 24.7   We did not recheck the level at last visit since he was still off the AndroGel, but after his vertebral fracture, I strongly advised him to restart one pump of 1.62% AndroGel.  I advised him to come back for labs in 2 months, but he did not do so.  Hypothyroidism: - dx 2016  Pt is on levothyroxine 50 mcg daily, taken: - in am - fasting - at least 30 min from b'fast - no Ca, Fe - + MVI, PPIs - both at bedtime - not on Biotin  Latest TFTs were reviewed and they were normal: Lab Results  Component Value Date   TSH 3.64 09/14/2016   TSH 1.73 03/12/2016   TSH 3.81 07/23/2015   FREET4 0.71 09/14/2016   FREET4 0.81 03/12/2016   FREET4 0.83 07/23/2015    ROS: Constitutional: no weight gain/no weight loss, no fatigue, no subjective hyperthermia, no subjective hypothermia Eyes: no blurry vision, no xerophthalmia ENT: no sore throat, no nodules palpated in throat, no dysphagia, no odynophagia, no hoarseness Cardiovascular: no CP/no SOB/no palpitations/no leg swelling Respiratory: no cough/no SOB/no wheezing Gastrointestinal: no N/no V/no D/no C/no acid reflux Musculoskeletal: no muscle aches/no joint aches Skin: no rashes, no hair loss Neurological: no tremors/no numbness/no tingling/no dizziness Decreased libido.  Problems with erections.  I reviewed pt's medications, allergies, PMH, social hx, family hx, and changes were documented in the history of present illness. Otherwise, unchanged from my initial visit note.  Past Medical History:  Diagnosis Date  . Depression   . Environmental allergies   . GERD (gastroesophageal reflux disease)   . Raynaud disease   . Thyroid disease    Past Surgical History:  Procedure Laterality Date  . EYE SURGERY    . HERNIA REPAIR    . TONSILLECTOMY     Social History   Social History  . Marital Status: Married    Spouse Name: N/A  . Number of Children: 4   Occupational History  .  librarian   Social History Main Topics  . Smoking status: Never Smoker   . Smokeless tobacco: Never Used  . Alcohol Use: Yes, wine, beer - socially  . Drug Use: No   Current Outpatient Medications  Medication Sig Dispense Refill  . albuterol (PROVENTIL HFA;VENTOLIN HFA) 108 (90 BASE) MCG/ACT inhaler Inhale 1-2 puffs into the lungs every  6 (six) hours as needed for wheezing or shortness of breath. 1 Inhaler 0  . alendronate (FOSAMAX) 70 MG tablet Take 1 tablet (70 mg total) by mouth once a week. Take with a full glass of water on an empty stomach. 12 tablet 3  . ANDROGEL 20.25 MG/1.25GM (1.62%) GEL Use 1 pump daily 75 g 2  . buPROPion (WELLBUTRIN XL) 300 MG 24 hr tablet Take 300 mg by mouth daily.    . Escitalopram Oxalate (LEXAPRO PO) Take 10 mg by mouth daily.     . fluticasone (FLONASE) 50 MCG/ACT nasal spray Place 2 sprays into the nose daily.    . Fluticasone-Salmeterol (ADVAIR) 250-50 MCG/DOSE AEPB Inhale 1 puff into the lungs every 12 (twelve) hours.    Marland Kitchen levothyroxine (SYNTHROID, LEVOTHROID) 50 MCG tablet Take 0.5 tablets (25 mcg total) by mouth daily. 90 tablet 3  . omeprazole (PRILOSEC) 40 MG capsule Take 40 mg by mouth daily.    Marland Kitchen oxybutynin (DITROPAN) 5 MG tablet Take 5 mg by mouth 3 (three) times daily.    Marland Kitchen oxyCODONE-acetaminophen (PERCOCET/ROXICET) 5-325 MG tablet Take 1 tablet by mouth every 6 (six) hours as needed for severe pain. 15 tablet 0  . predniSONE (DELTASONE) 20 MG tablet 3 tabs po day one, then 2 po daily x 4 days 11 tablet 0   No current facility-administered medications for this visit.     Allergies  Allergen Reactions  . Ibuprofen Other (See Comments)  . Molds & Smuts    No family history on file.  Patient is adopted.  PE: BP (!) 124/92 (BP Location: Left Arm, Patient Position: Sitting, Cuff Size: Large)   Pulse 82   Ht 5\' 6"  (1.676 m)   Wt 217 lb 12.8 oz (98.8 kg)   SpO2 99%   BMI 35.15 kg/m  Wt Readings from Last 3 Encounters:  07/13/17 217 lb  12.8 oz (98.8 kg)  09/14/16 220 lb (99.8 kg)  06/14/16 210 lb (95.3 kg)   Constitutional: overweight, in NAD Eyes: PERRLA, EOMI, no exophthalmos ENT: moist mucous membranes, no thyromegaly, no cervical lymphadenopathy Cardiovascular: RRR, No MRG Respiratory: CTA B Gastrointestinal: abdomen soft, NT, ND, BS+ Musculoskeletal: no deformities, strength intact in all 4 Skin: moist, warm, no rashes Neurological: no tremor with outstretched hands, DTR normal in all 4  ASSESSMENT: 1. Hypogonadotropic Hypogonadism - Initially started testosterone replacement in Spring 2016 - I do not have a clear reason for his hypogonadism. Possible etiologies:  Drugs (on Lexapro and had steroid injections in the past, but not in the last 10 years per his report; he has not used anabolic steroids in the past that is not using illicit drugs now)  Pituitary tumor - I doubt this in the setting of normal pituitary workup (except testosterone) - an MRI was denied by his insurance...  Chronic conditions (he does not have diabetes, severe hyperlipidemia; he does have elevated transaminases...)  Weight gain (he has had quite a bit of weight gain over the years which can cause hypogonadism)  Obstructive sleep apnea (no history of this)  Age-related (he is too young for this)  Hemochromatosis (IBC panel was normal)   Testicular pathology including cancer (Beta hCG and estradiol were normal)   2. Hypothyroidism  3.  Osteoporosis - T12 compression fx  PLAN:  1. Hypogonadotropic Hypogonadism - atient with history of hypogonadism, which was diagnosed during investigation for excessive sweating and weight gain diagnosed after last visit - He was on testosterone in the past (from  04-11/2015) but he did not think he saw significant improvement in his symptoms (low libido and problems with erections) on testosterone replacement and he stopped. - After his vertebral fracture, since there was a suspicion for  osteoporosis, we started 1 pump of 1.62% androgen daily at last visit, in 08/2016.  Osteoporosis was later confirmed on a bone density scan. - He did not return for a recheck of his testosterone level after he restarted AndroGel - we will need to check a testosterone level (he is fasting this am - time: 9:00 am), CBC, PSA - I advised him to get DRE's by his PCP or urologist annually  2. Hypothyroidism - latest thyroid labs reviewed with pt >> normal in 08/2016 - he continues on LT4 50 mcg daily - pt feels good on this dose. - we discussed about taking the thyroid hormone every day, with water, >30 minutes before breakfast, separated by >4 hours from acid reflux medications, calcium, iron, multivitamins. Pt. is taking it correctly. - will check thyroid tests today: TSH and fT4 - If labs are abnormal, he will need to return for repeat TFTs in 1.5 months  3.  Osteoporosis  - Diagnosed after last visit - with h/o T12 vertebral fracture in 05/2016, healed - We again discussed that his hypogonadism is a risk factor for osteoporosis so we need to continue testosterone. - I advised him to start Fosamax after his bone density scan returned >> took 1 dose, then stopped 2/2 increased GERD - he would like to repeat his BMD after a year from previous  (08/2017) and then decide if he needs further tx). If he does >> I suggested parenteral tx >> Prolia or Reclast >>  He agrees - He had a slightly low vitamin D at last visit so we started 2000 units over-the-counter vitamin D3.  Vitamin D level normalized in 10/2016; However, he stopped th supplement and remained only on the MVI. We will recheck today to see if he needs to restart vit D..  Component     Latest Ref Rng & Units 07/13/2017  WBC     4.0 - 10.5 K/uL 5.3  RBC     4.22 - 5.81 Mil/uL 5.05  Platelets     150.0 - 400.0 K/uL 374.0  Hemoglobin     13.0 - 17.0 g/dL 09.8  HCT     11.9 - 14.7 % 45.1  MCV     78.0 - 100.0 fl 89.3  MCHC     30.0 -  36.0 g/dL 82.9  RDW     56.2 - 13.0 % 14.0  T4,Free(Direct)     0.60 - 1.60 ng/dL 8.65  TSH     7.84 - 6.96 uIU/mL 2.59  PSA     0.10 - 4.00 ng/mL 0.28  VITD     30.00 - 100.00 ng/mL 22.63 (L)   CBC, PSA, TFTs all normal.  Vitamin D low.  He needs to restart his 2000 units vitamin D daily.  Testosterone level also low, but improved from before: Component     Latest Ref Rng & Units 03/12/2016 07/13/2017  Testosterone, Serum (Total)     ng/dL  295 (L)  % Free Testosterone     %  1.4  Free Testosterone, S     52-280 pg/mL  32 (L)  Sex Hormone Binding Globulin     nmol/L  29.8  Testosterone     264 - 916 ng/dL 284 (L)   Testosterone Free  7.2 - 24.0 pg/mL 5.0 (L)   Sex Horm Binding Glob, Serum     19.3 - 76.4 nmol/L 24.7    If you did not miss any doses of testosterone, I will advised him to alternate 1 with 2 pumps of AndroGel every other day.  Zachary Pavlovristina Nalayah Hitt, MD PhD [facility]

## 2017-07-13 NOTE — Patient Instructions (Addendum)
Continue testosterone 1 pump daily.  Call me in 3 months to order the new bone density scan.  Continue Levothyroxine 50 mcg daily.  Take the thyroid hormone every day, with water, at least 30 minutes before breakfast, separated by at least 4 hours from: - acid reflux medications - calcium - iron - multivitamins  Please stop at the lab.  Please come back for a follow-up appointment in 1 year.

## 2017-07-23 LAB — TESTOSTERONE, FREE AND TOTAL (INCLUDES SHBG)-(MALES)
% Free Testosterone: 1.4 %
Free Testosterone, S: 32 pg/mL — ABNORMAL LOW
SEX HORMONE BINDING GLOBULIN: 29.8 nmol/L
Testosterone, Serum (Total): 231 ng/dL — ABNORMAL LOW

## 2017-11-14 IMAGING — CT CT L SPINE W/O CM
3 of 5 series · 14 of 33 positions shown, 16 images · non-contrast
Comparison: Plain film 06/14/2016

CLINICAL DATA: Sub some nights.  T12 fracture

EXAM:
CT THORACIC AND LUMBAR SPINE WITHOUT CONTRAST
TECHNIQUE: Multidetector CT imaging of the thoracic and lumbar spine was
performed without contrast. Multiplanar CT image reconstructions
were also generated.

[Series 4: t-spine 2.0 st · axial · 0.50mm/px · z∈[+881,+1379]mm · 8 of 295 slices shown, 10 images]
[im 23/295  soft-tissue]
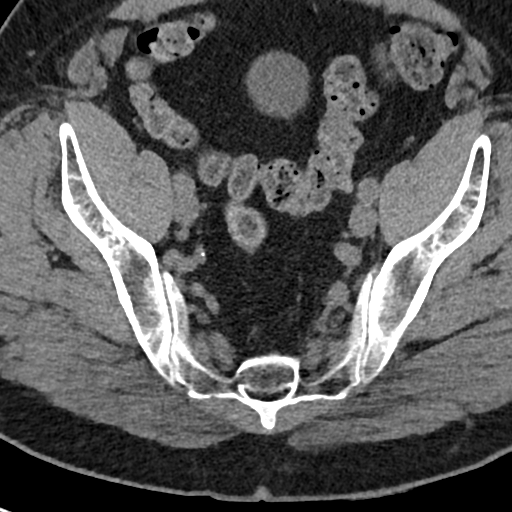
[im 23/295  bone]
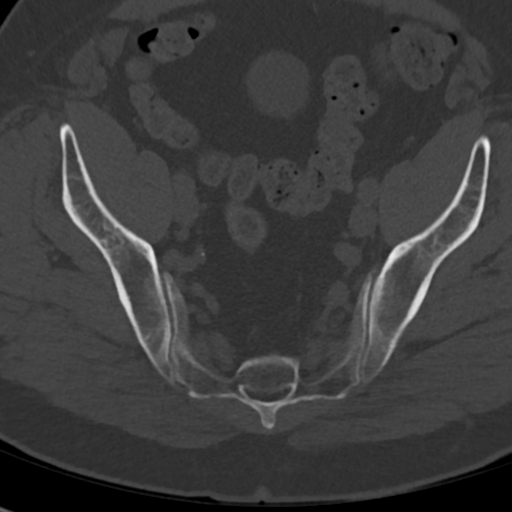
[im 68/295  bone]
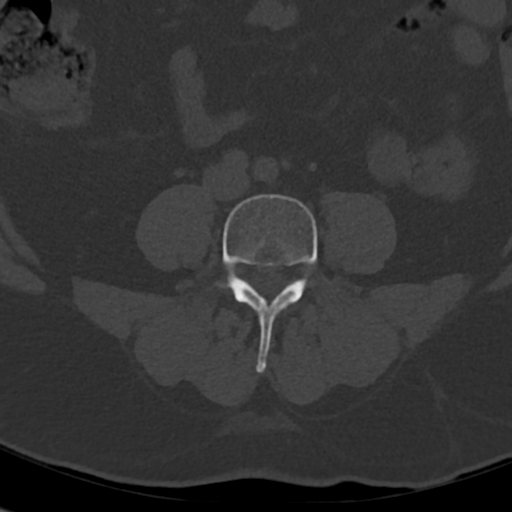
[im 91/295  bone]
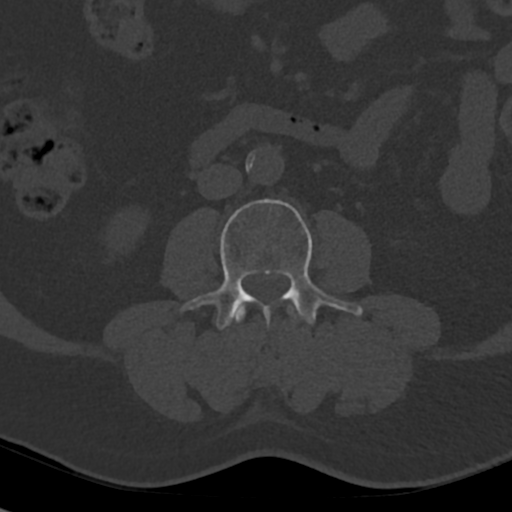
[im 136/295  bone]
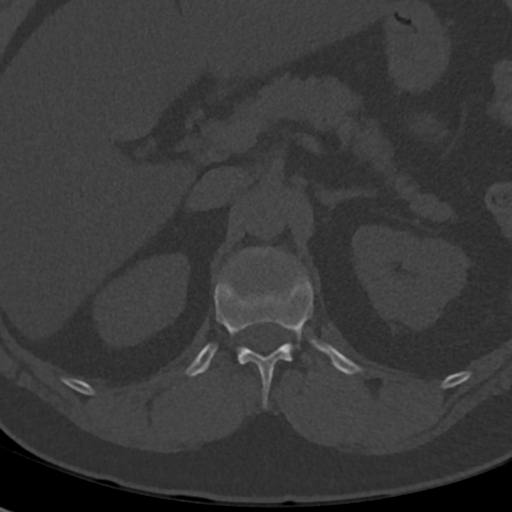
[im 159/295  soft-tissue]
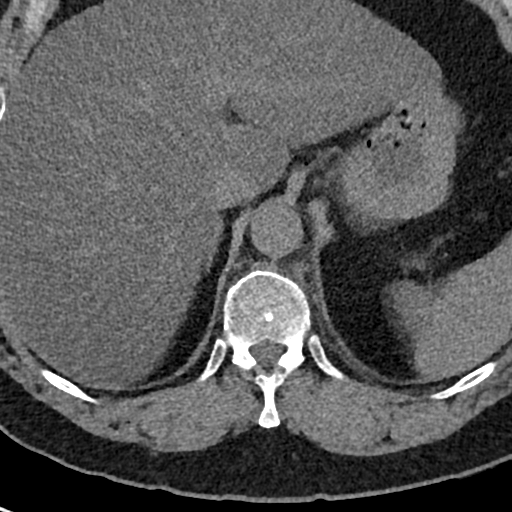
[im 159/295  bone]
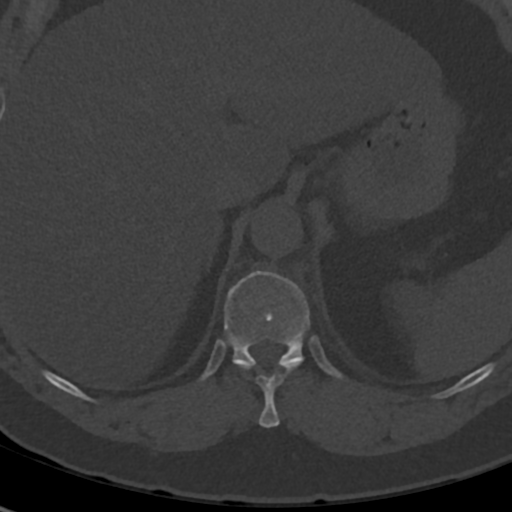
[im 204/295  bone]
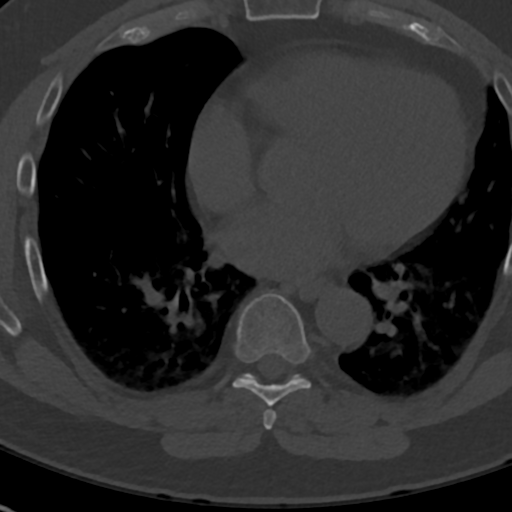
[im 227/295  bone]
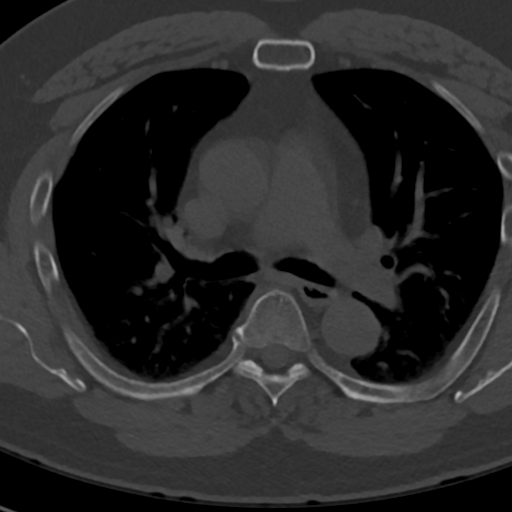
[im 272/295  bone]
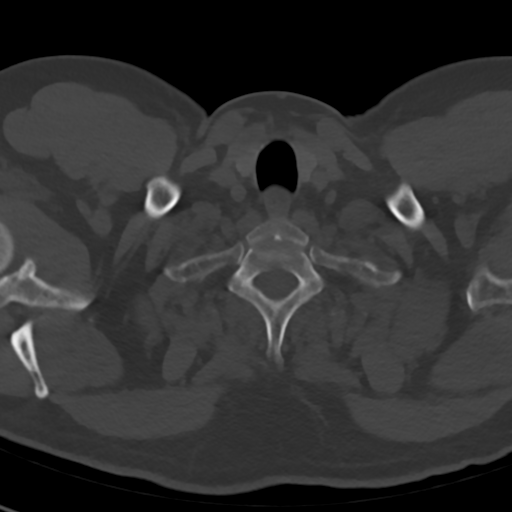

[Series 8: l-spine 2.0 cor bone · coronal · 0.39mm/px · 1 of 90 slices shown (1 of 2)]
[im 45/90  bone]
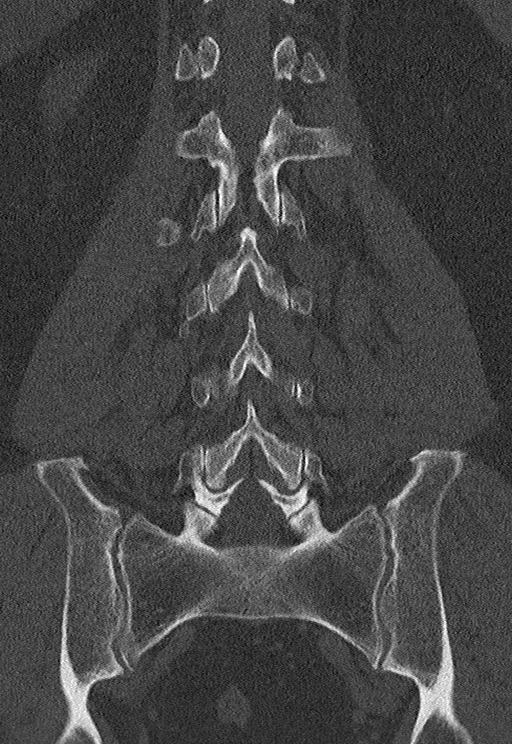

[Series 9: l-spine 2.0 cor bone · sagittal · 0.39mm/px · 5 of 101 slices shown (2 of 2)]
[im 15/101  bone]
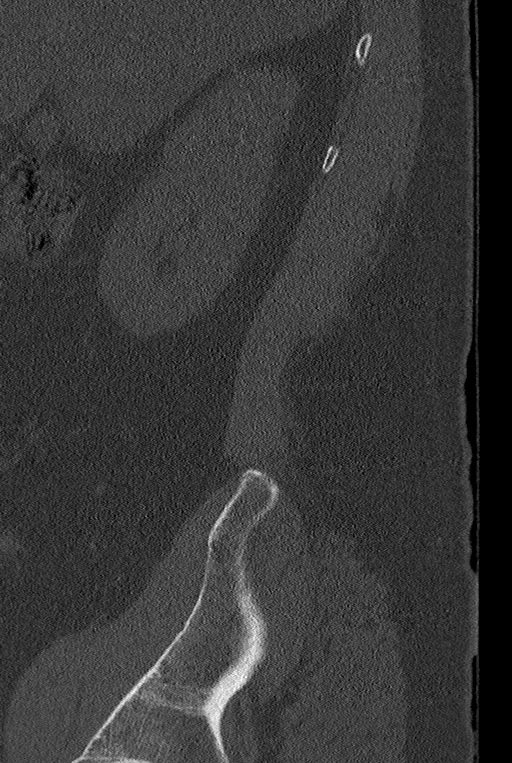
[im 29/101  bone]
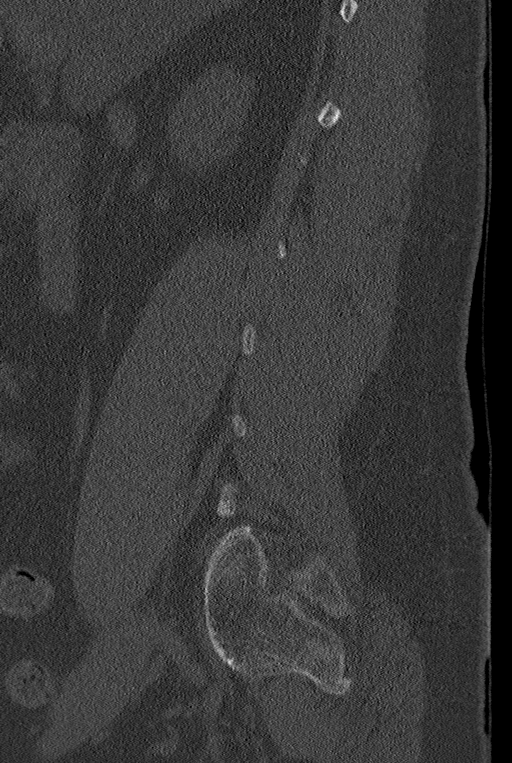
[im 43/101  bone]
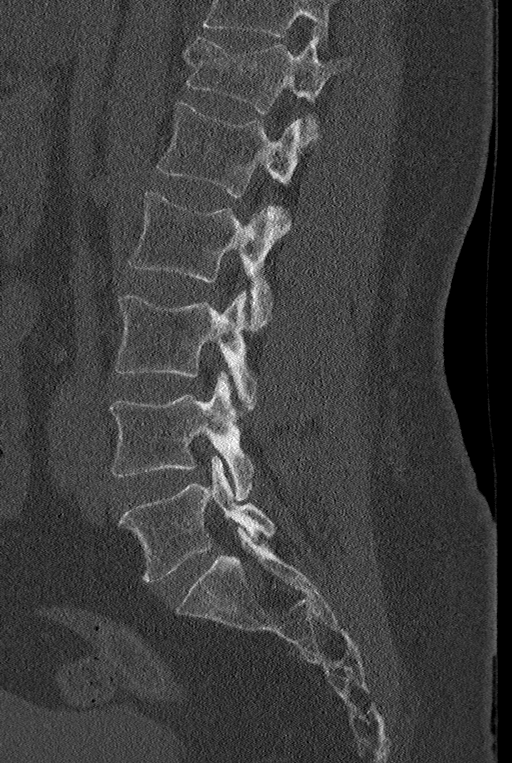
[im 58/101  bone]
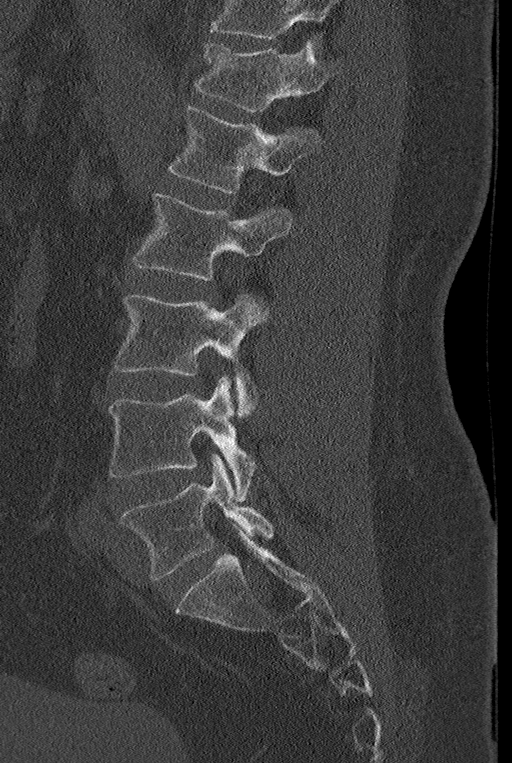
[im 72/101  bone]
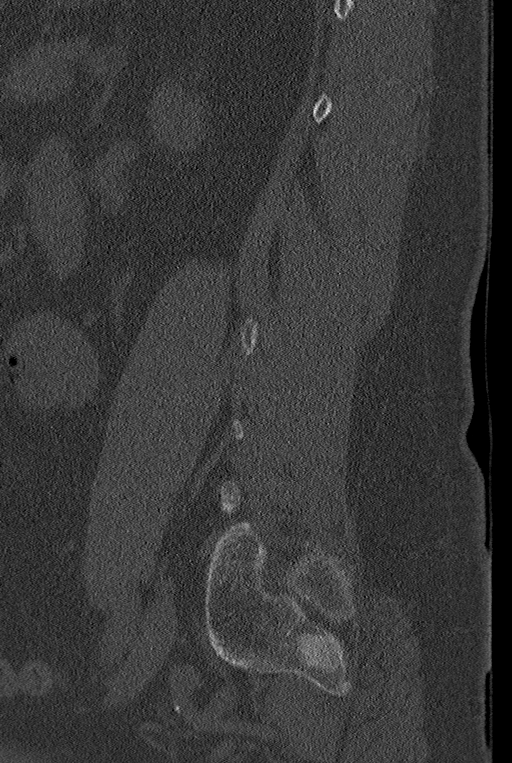

[14 of 33 positions shown; findings below may reference images not displayed]

FINDINGS: CT THORACIC SPINE FINDINGS

Alignment: Normal

Vertebrae: Fracture the T12 vertebral body involving predominantly
the superior endplate and the anterior wall with 15 to 20% loss
vertebral body height anteriorly and and 5 to 10% posteriorly. No
retropulsion. The pedicles appear normal. The neural arches are
normal. No significant paraspinal hematoma. No epidural hematoma.

No additional fracture evident in the more superior thoracic
vertebral bodies appear

CT LUMBAR SPINE FINDINGS

Segmentation: Normal

Alignment: Normal

Vertebrae: No fracture or subluxation

Paraspinal and other soft tissues: No epidural paraspinal hematoma.

Disc levels: Unremarkable
IMPRESSION: CT THORACIC SPINE IMPRESSION

1. Compression fracture of the T12 vertebral body involving the
superior endplate and anterior wall as described above. No
retropulsion. No involvement of the pedicles or neural arch.
2. No epidural or paraspinal hematoma.

CT LUMBAR SPINE IMPRESSION

No lumbar spine fracture.

## 2018-07-12 DIAGNOSIS — M810 Age-related osteoporosis without current pathological fracture: Secondary | ICD-10-CM | POA: Insufficient documentation

## 2018-07-12 DIAGNOSIS — E559 Vitamin D deficiency, unspecified: Secondary | ICD-10-CM | POA: Insufficient documentation

## 2018-07-12 NOTE — Progress Notes (Deleted)
Patient ID: Zachary Aguirre, male   DOB: 12-01-1960, 58 y.o.   MRN: [id number]  HPI: Zachary Aguirre is a 58 y.o.-year-old man, returning for follow-up for hypogonadotropic hypogonadism, osteoporosis, vitamin D deficiency, and hypothyroidism. Last visit 1 year ago.  At that time, he was off his AndroGel for 3 months.  Since he does have a history of hypogonadotropic hypogonadism, we restarted him on testosterone.  Reviewed and addended history: He was diagnosed with hypogonadism in Spring 2016, during investigation for sweating and weight gain. He was started on testosterone 1.62% AndroGel >> initially 4 pumps daily.   Per review of PCPs office visit notes, the AndroGel dose was increased in 09/2014, as a testosterone level was low at that time at 141.   After this increase in dose, a  testosterone level from 02/04/2015 was normal, 394.6 ([phone]).  He started to feel a little better after he increased testosterone but not significantly so.  Of note, he still has excessive sweating (better in winter 2017-2018), and uses Ditropan 5 mg twice a day for this.  He is on omeprazole.  He admits for decreased libido. Has difficulty obtaining or maintaining an erection >> only a little better. Tried Viagra >> worked, but HA the second time he used it. Did not see urology. No trauma to testes, testicular irradiation or surgery No h/o of mumps orchitis/but has h/o autoimmune ds.(vitiligo) No h/o cryptorchidism. He grew and went through puberty like his peers. No shrinking of testes. No very small testes (<5 ml).    No breast discomfort/gynecomastia    No loss of body hair (axillary/pubic)/decreased need for shaving. + height loss. No abnormal sense of smell (only allergies). ? hot flushes No vision problems. No worst HA of his life. No FH of hypogonadism/infertility  No personal h/o infertility - has 4 children No FH of hemochromatosis or pituitary tumors + weight gain (35 lbs in last 2 year) - cut  out snacks No chronic diseases No chronic pain. Not on opiates, had steroid inj in spine - 2005. Had Prednisone tapers for SOB. No more than 2 drinks a day of alcohol at a time, and this is rarely No anabolic steroids use. No herbal medicines. He is on bupropion. He continues Lexapro. ? family history of early cardiac disease.   He also has a history of elevated AST and ALT  Reviewed previous labs:  Component     Latest Ref Rng & Units 07/23/2015  Testosterone     250 - 827 ng/dL 722 (L)  Sex Horm Binding Glob, Serum     10 - 50 nmol/L 19  Testosterone Free     47.0 - 244.0 pg/mL 39.8 (L)  Testosterone-% Free     1.6 - 2.9 % 2.5  Iron     42 - 165 ug/dL 70  Transferrin     575.0 - 360.0 mg/dL 518.3  Saturation Ratios     20.0 - 50.0 % 20.6  IGF-I, LC/MS     50 - 317 ng/mL 71  Z-Score (Male)     -2.0 - 2.0 SD -1.3  Estradiol, Free     ADULTS: < OR = 0.45 pg/mL 0.45  Estradiol     ADULTS: < OR = 29 pg/mL 22  FSH     1.4 - 18.1 mIU/ML 5.9  Beta hCG, Tumor Marker     <5.0 mIU/mL < 2.0  Prolactin     2.0 - 18.0 ng/mL 3.9  PSA  0.10 - 4.00 ng/mL 0.30  Cortisol, Plasma     ug/dL 6.8  [id number] ACTH     6 - 50 pg/mL 20  Hemoglobin A1C     4.6 - 6.5 % 6.1  We wanted to check a pituitary MRI >> but this was not covered by his insurance.  He restarted testosterone gel - 2 pumps a day 08/2015 >> but again stopped after the following labs in 11/2015: 12/04/2015: Total testosterone 211 [phone]) - 10:37 am  Testosterone was low again in 02/2016 so I suggested to restart AndroGel, but he did not do so: Component     Latest Ref Rng & Units 03/12/2016  Testosterone     264 - 916 ng/dL 960212 (L)  Testosterone Free     7.2 - 24.0 pg/mL 5.0 (L)  Sex Horm Binding Glob, Serum     19.3 - 76.4 nmol/L 24.7   After his vertebral fracture, I again advised him to restart 1 pump of 1.62% AndroGel.  However, the testosterone level was still low after this (06/2017) and I advised him  to start alternating 1 with 2 pumps of AndroGel every other day.  He did not return for recheck afterwards.  Labs from last visit were reviewed: Component     Latest Ref Rng & Units 07/13/2017  WBC     4.0 - 10.5 K/uL 5.3  RBC     4.22 - 5.81 Mil/uL 5.05  Platelets     150.0 - 400.0 K/uL 374.0  Hemoglobin     13.0 - 17.0 g/dL 45.415.3  HCT     09.839.0 - 11.952.0 % 45.1  MCV     78.0 - 100.0 fl 89.3  MCHC     30.0 - 36.0 g/dL 14.734.0  RDW     82.911.5 - 56.215.5 % 14.0  Testosterone, Serum (Total)     ng/dL 130231 (L)  % Free Testosterone     % 1.4  Free Testosterone, S     pg/mL 32 (L)  Sex Hormone Binding Globulin     nmol/L 29.8  PSA     0.10 - 4.00 ng/mL 0.28    Hypothyroidism: - dx 2016  Pt is on levothyroxine 50 mcg daily, taken: - in am - fasting - at least 30 min from b'fast - no Ca, Fe - + MVI, PPIs at bedtime - not on Biotin  Latest TFTs reviewed and they were normal at last check: Lab Results  Component Value Date   TSH 2.59 07/13/2017   TSH 3.64 09/14/2016   TSH 1.73 03/12/2016   TSH 3.81 07/23/2015   FREET4 0.80 07/13/2017   FREET4 0.71 09/14/2016   FREET4 0.81 03/12/2016   FREET4 0.83 07/23/2015   Osteoporosis: -He has a history of a T12 compression fracture after he fell on ice on his driveway in 05/2016.    A bone density scan showed osteoporosis: 09/29/2016 Lumbar spine (L1-L4) Femoral neck (FN)  T-score -2.7 RFN: -2.1 LFN: -2.5   At that time, we discussed about restarting testosterone and I also advised him to start Fosamax.  He took 1 dose but developed GERD so he stopped.  His vitamin D level was also low: Lab Results  Component Value Date   VD25OH 22.63 (L) 07/13/2017   VD25OH 34.72 11/17/2016   VD25OH 27.95 (L) 09/14/2016   I advised him to start 2000 units vitamin D daily.  ROS: Constitutional: no weight gain/no weight loss, no fatigue, no subjective hyperthermia, no subjective hypothermia Eyes:  no blurry vision, no xerophthalmia ENT: no  sore throat, no nodules palpated in neck, no dysphagia, no odynophagia, no hoarseness Cardiovascular: no CP/no SOB/no palpitations/no leg swelling Respiratory: no cough/no SOB/no wheezing Gastrointestinal: no N/no V/no D/no C/no acid reflux Musculoskeletal: no muscle aches/no joint aches Skin: no rashes, no hair loss Neurological: no tremors/no numbness/no tingling/no dizziness + Difficulty with erections, decreased libido  I reviewed pt's medications, allergies, PMH, social hx, family hx, and changes were documented in the history of present illness. Otherwise, unchanged from my initial visit note.  Past Medical History:  Diagnosis Date  . Depression   . Environmental allergies   . GERD (gastroesophageal reflux disease)   . Raynaud disease   . Thyroid disease    Past Surgical History:  Procedure Laterality Date  . EYE SURGERY    . HERNIA REPAIR    . TONSILLECTOMY     Social History   Social History  . Marital Status: Married    Spouse Name: N/A  . Number of Children: 4   Occupational History  . librarian   Social History Main Topics  . Smoking status: Never Smoker   . Smokeless tobacco: Never Used  . Alcohol Use: Yes, wine, beer - socially  . Drug Use: No   Current Outpatient Medications  Medication Sig Dispense Refill  . albuterol (PROVENTIL HFA;VENTOLIN HFA) 108 (90 BASE) MCG/ACT inhaler Inhale 1-2 puffs into the lungs every 6 (six) hours as needed for wheezing or shortness of breath. 1 Inhaler 0  . ANDROGEL 20.25 MG/1.25GM (1.62%) GEL Use 1 pump daily 75 g 2  . buPROPion (WELLBUTRIN XL) 300 MG 24 hr tablet Take 300 mg by mouth daily.    . Escitalopram Oxalate (LEXAPRO PO) Take 10 mg by mouth daily.     . fluticasone (FLONASE) 50 MCG/ACT nasal spray Place 2 sprays into the nose daily.    . Fluticasone-Salmeterol (ADVAIR) 250-50 MCG/DOSE AEPB Inhale 1 puff into the lungs every 12 (twelve) hours.    Marland Kitchen levothyroxine (SYNTHROID, LEVOTHROID) 50 MCG tablet Take 1 tablet  (50 mcg total) by mouth daily. 90 tablet 3  . omeprazole (PRILOSEC) 40 MG capsule Take 40 mg by mouth daily.    Marland Kitchen oxybutynin (DITROPAN) 5 MG tablet Take 5 mg by mouth 3 (three) times daily.    Marland Kitchen oxyCODONE-acetaminophen (PERCOCET/ROXICET) 5-325 MG tablet Take 1 tablet by mouth every 6 (six) hours as needed for severe pain. 15 tablet 0   No current facility-administered medications for this visit.     Allergies  Allergen Reactions  . Ibuprofen Other (See Comments)  . Molds & Smuts    No family history on file.  Patient is adopted.  PE: There were no vitals taken for this visit. Wt Readings from Last 3 Encounters:  07/13/17 217 lb 12.8 oz (98.8 kg)  09/14/16 220 lb (99.8 kg)  06/14/16 210 lb (95.3 kg)   Constitutional: overweight, in NAD Eyes: PERRLA, EOMI, no exophthalmos ENT: moist mucous membranes, no thyromegaly, no cervical lymphadenopathy Cardiovascular: RRR, No MRG Respiratory: CTA B Gastrointestinal: abdomen soft, NT, ND, BS+ Musculoskeletal: no deformities, strength intact in all 4 Skin: moist, warm, no rashes Neurological: no tremor with outstretched hands, DTR normal in all 4  ASSESSMENT: 1. Hypogonadotropic Hypogonadism - Initially started testosterone replacement in Spring 2016 - I do not have a clear reason for his hypogonadism. Possible etiologies:  Drugs (on Lexapro and had steroid injections in the past, but not in the last 10 years per his report;  he has not used anabolic steroids in the past that is not using illicit drugs now)  Pituitary tumor - I doubt this in the setting of normal pituitary workup (except testosterone) - an MRI was denied by his insurance...  Chronic conditions (he does not have diabetes, severe hyperlipidemia; he does have elevated transaminases...)  Weight gain (he has had quite a bit of weight gain over the years which can cause hypogonadism)  Obstructive sleep apnea (no history of this)  Age-related (he is too young for  this)  Hemochromatosis (IBC panel was normal)   Testicular pathology including cancer (Beta hCG and estradiol were normal)   2. Hypothyroidism  3.  Osteoporosis - T12 compression fx  4.  Vitamin D deficiency  PLAN:  1. Hypogonadotropic Hypogonadism -Patient with history of hypogonadism, diagnosed during investigation for excessive sweating and weight gain -He was on testosterone in the past, from 0 four 207/2017, but he did not think is a significant improvement in his symptoms (low libido and problems with erections) on testosterone replacement and he stopped.  However, he had a vertebral fracture and a bone density scan showed osteoporosis in 2018, so I advised him to restart testosterone 1 pump of 1.62% AndroGel daily.  At last visit, testosterone was still low on this dose and I advised him to alternate 1 with 2 pumps every other day.  I advised him to return for recheck of his testosterone fasting, between 8 and 9 AM, but he did not return for this test. -At this visit, we will check the testosterone level, CBC, PSA.  The CBC and PSA were normal at last visit. -I also advised him to get DRE's by his PCP or urologist annually.  2. Hypothyroidism - latest thyroid labs reviewed with pt >> normal in 06/2017 - he continues on LT4 50 mcg daily - pt feels good on this dose. - we discussed about taking the thyroid hormone every day, with water, >30 minutes before breakfast, separated by >4 hours from acid reflux medications, calcium, iron, multivitamins. Pt. is taking it correctly. - will check thyroid tests today: TSH and fT4 - If labs are abnormal, he will need to return for repeat TFTs in 1.5 months  3.  Osteoporosis  -Diagnosed after T12 vertebral fracture in 05/2016.  This has healed. -His BMD from 09/2016 showed osteoporosis. -At that time, I advised him to continue testosterone and we also discussed about the possibility of starting Fosamax.  He took 1 dose but stopped due to  increased GERD. -We will plan to repeat a bone density scan this year in May.  We did discuss at last visit about parenteral treatment: Prolia or Reclast and he agrees.  4.  Vitamin D deficiency -His vitamin D level was low at last visit, at 22, so I advised him to start 2000 units vitamin D daily. -We will recheck his level today  Carlus Pavlovristina Marionette Meskill, MD PhD [facility]

## 2018-07-13 ENCOUNTER — Ambulatory Visit: Payer: Managed Care, Other (non HMO) | Admitting: Internal Medicine

## 2018-07-13 ENCOUNTER — Telehealth: Payer: Self-pay | Admitting: Internal Medicine

## 2018-07-13 NOTE — Telephone Encounter (Signed)
1year

## 2018-07-13 NOTE — Telephone Encounter (Signed)
Patient no showed today's appt. Please advise on how to follow up. °A. No follow up necessary. °B. Follow up urgent. Contact patient immediately. °C. Follow up necessary. Contact patient and schedule visit in ___ days. °D. Follow up advised. Contact patient and schedule visit in ____weeks. ° °Would you like the NS fee to be applied to this visit? ° °

## 2018-07-14 NOTE — Telephone Encounter (Signed)
Patient stated they are unsure if they would like to reschedule at this time.

## 2022-04-21 ENCOUNTER — Other Ambulatory Visit: Payer: Self-pay | Admitting: Physician Assistant

## 2022-04-21 DIAGNOSIS — M7989 Other specified soft tissue disorders: Secondary | ICD-10-CM

## 2022-04-29 ENCOUNTER — Other Ambulatory Visit: Payer: Managed Care, Other (non HMO)

## 2022-05-11 ENCOUNTER — Ambulatory Visit
Admission: RE | Admit: 2022-05-11 | Discharge: 2022-05-11 | Disposition: A | Discharge status: Home / Self Care | Payer: Managed Care, Other (non HMO) | Source: Ambulatory Visit | Attending: Physician Assistant | Admitting: Physician Assistant

## 2022-05-11 DIAGNOSIS — M7989 Other specified soft tissue disorders: Secondary | ICD-10-CM
